# Patient Record
Sex: Female | Born: 1975 | Race: White | Hispanic: No | Marital: Married | Smoking: Never smoker
Health system: Southern US, Community
[De-identification: ages and names within clinical notes are randomized; demographics above are authoritative.]

## PROBLEM LIST (undated history)

## (undated) DIAGNOSIS — R748 Abnormal levels of other serum enzymes: Secondary | ICD-10-CM

## (undated) DIAGNOSIS — E669 Obesity, unspecified: Secondary | ICD-10-CM

## (undated) DIAGNOSIS — K8050 Calculus of bile duct without cholangitis or cholecystitis without obstruction: Secondary | ICD-10-CM

## (undated) DIAGNOSIS — E079 Disorder of thyroid, unspecified: Secondary | ICD-10-CM

## (undated) DIAGNOSIS — K8043 Calculus of bile duct with acute cholecystitis with obstruction: Secondary | ICD-10-CM

## (undated) DIAGNOSIS — E039 Hypothyroidism, unspecified: Secondary | ICD-10-CM

## (undated) DIAGNOSIS — R932 Abnormal findings on diagnostic imaging of liver and biliary tract: Secondary | ICD-10-CM

## (undated) DIAGNOSIS — K8020 Calculus of gallbladder without cholecystitis without obstruction: Secondary | ICD-10-CM

## (undated) HISTORY — DX: Calculus of bile duct without cholangitis or cholecystitis without obstruction: K80.50

## (undated) HISTORY — DX: Hypothyroidism, unspecified: E03.9

## (undated) HISTORY — DX: Abnormal findings on diagnostic imaging of liver and biliary tract: R93.2

## (undated) HISTORY — DX: Calculus of bile duct with acute cholecystitis with obstruction: K80.43

## (undated) HISTORY — DX: Abnormal levels of other serum enzymes: R74.8

## (undated) HISTORY — DX: Obesity, unspecified: E66.9

---

## 2011-09-04 DIAGNOSIS — E669 Obesity, unspecified: Secondary | ICD-10-CM | POA: Insufficient documentation

## 2011-09-04 DIAGNOSIS — E039 Hypothyroidism, unspecified: Secondary | ICD-10-CM

## 2011-09-04 HISTORY — DX: Obesity, unspecified: E66.9

## 2011-09-04 HISTORY — DX: Hypothyroidism, unspecified: E03.9

## 2017-02-06 ENCOUNTER — Inpatient Hospital Stay
Admission: EM | Admit: 2017-02-06 | Discharge: 2017-02-12 | DRG: 419 | Disposition: A | Discharge status: Home / Self Care | Payer: Managed Care, Other (non HMO) | Attending: Surgery | Admitting: Surgery

## 2017-02-06 ENCOUNTER — Encounter: Payer: Self-pay | Admitting: Emergency Medicine

## 2017-02-06 ENCOUNTER — Emergency Department: Payer: Managed Care, Other (non HMO)

## 2017-02-06 DIAGNOSIS — Z79899 Other long term (current) drug therapy: Secondary | ICD-10-CM

## 2017-02-06 DIAGNOSIS — K8067 Calculus of gallbladder and bile duct with acute and chronic cholecystitis with obstruction: Secondary | ICD-10-CM | POA: Diagnosis not present

## 2017-02-06 DIAGNOSIS — M25519 Pain in unspecified shoulder: Secondary | ICD-10-CM | POA: Diagnosis not present

## 2017-02-06 DIAGNOSIS — R932 Abnormal findings on diagnostic imaging of liver and biliary tract: Secondary | ICD-10-CM

## 2017-02-06 DIAGNOSIS — K8050 Calculus of bile duct without cholangitis or cholecystitis without obstruction: Secondary | ICD-10-CM

## 2017-02-06 DIAGNOSIS — K8043 Calculus of bile duct with acute cholecystitis with obstruction: Secondary | ICD-10-CM | POA: Diagnosis present

## 2017-02-06 DIAGNOSIS — R1013 Epigastric pain: Secondary | ICD-10-CM

## 2017-02-06 DIAGNOSIS — K8042 Calculus of bile duct with acute cholecystitis without obstruction: Secondary | ICD-10-CM | POA: Diagnosis not present

## 2017-02-06 DIAGNOSIS — R748 Abnormal levels of other serum enzymes: Secondary | ICD-10-CM

## 2017-02-06 DIAGNOSIS — R001 Bradycardia, unspecified: Secondary | ICD-10-CM | POA: Diagnosis present

## 2017-02-06 DIAGNOSIS — Z793 Long term (current) use of hormonal contraceptives: Secondary | ICD-10-CM

## 2017-02-06 DIAGNOSIS — K56609 Unspecified intestinal obstruction, unspecified as to partial versus complete obstruction: Secondary | ICD-10-CM

## 2017-02-06 DIAGNOSIS — K5900 Constipation, unspecified: Secondary | ICD-10-CM | POA: Diagnosis not present

## 2017-02-06 DIAGNOSIS — Z419 Encounter for procedure for purposes other than remedying health state, unspecified: Secondary | ICD-10-CM

## 2017-02-06 DIAGNOSIS — E039 Hypothyroidism, unspecified: Secondary | ICD-10-CM | POA: Diagnosis present

## 2017-02-06 HISTORY — DX: Disorder of thyroid, unspecified: E07.9

## 2017-02-06 HISTORY — DX: Calculus of gallbladder without cholecystitis without obstruction: K80.20

## 2017-02-06 LAB — CBC
HEMATOCRIT: 37.8 % (ref 35.0–47.0)
Hemoglobin: 13.4 g/dL (ref 12.0–16.0)
MCH: 34.6 pg — ABNORMAL HIGH (ref 26.0–34.0)
MCHC: 35.4 g/dL (ref 32.0–36.0)
MCV: 97.8 fL (ref 80.0–100.0)
Platelets: 286 10*3/uL (ref 150–440)
RBC: 3.86 MIL/uL (ref 3.80–5.20)
RDW: 12.2 % (ref 11.5–14.5)
WBC: 6.8 10*3/uL (ref 3.6–11.0)

## 2017-02-06 LAB — COMPREHENSIVE METABOLIC PANEL
ALBUMIN: 4.2 g/dL (ref 3.5–5.0)
ALK PHOS: 191 U/L — AB (ref 38–126)
ALT: 228 U/L — ABNORMAL HIGH (ref 14–54)
AST: 175 U/L — AB (ref 15–41)
Anion gap: 7 (ref 5–15)
BILIRUBIN TOTAL: 4.1 mg/dL — AB (ref 0.3–1.2)
BUN: 8 mg/dL (ref 6–20)
CO2: 27 mmol/L (ref 22–32)
Calcium: 9.2 mg/dL (ref 8.9–10.3)
Chloride: 105 mmol/L (ref 101–111)
Creatinine, Ser: 0.64 mg/dL (ref 0.44–1.00)
GFR calc Af Amer: >60 mL/min (ref >60)
GFR calc non Af Amer: >60 mL/min (ref >60)
GLUCOSE: 99 mg/dL (ref 65–99)
POTASSIUM: 3.7 mmol/L (ref 3.5–5.1)
Sodium: 139 mmol/L (ref 135–145)
TOTAL PROTEIN: 7.5 g/dL (ref 6.5–8.1)

## 2017-02-06 LAB — URINALYSIS, COMPLETE (UACMP) WITH MICROSCOPIC
Bilirubin Urine: NEGATIVE
Glucose, UA: NEGATIVE mg/dL
HGB URINE DIPSTICK: NEGATIVE
Ketones, ur: NEGATIVE mg/dL
NITRITE: NEGATIVE
Protein, ur: NEGATIVE mg/dL
SPECIFIC GRAVITY, URINE: 1.006 (ref 1.005–1.030)
pH: 6 (ref 5.0–8.0)

## 2017-02-06 LAB — POCT PREGNANCY, URINE: PREG TEST UR: NEGATIVE

## 2017-02-06 LAB — LIPASE, BLOOD: Lipase: 23 U/L (ref 11–51)

## 2017-02-06 MED ORDER — IOPAMIDOL (ISOVUE-300) INJECTION 61%
30.0000 mL | Freq: Once | INTRAVENOUS | Status: AC
  Administered 2017-02-06: 30 mL via ORAL
  Filled 2017-02-06: qty 30

## 2017-02-06 MED ORDER — SODIUM CHLORIDE 0.9 % IV BOLUS (SEPSIS)
1000.0000 mL | Freq: Once | INTRAVENOUS | Status: AC
  Administered 2017-02-06: 1000 mL via INTRAVENOUS

## 2017-02-06 MED ORDER — LACTATED RINGERS IV BOLUS (SEPSIS)
1000.0000 mL | Freq: Once | INTRAVENOUS | Status: AC
  Administered 2017-02-06: 1000 mL via INTRAVENOUS
  Filled 2017-02-06: qty 1000

## 2017-02-06 MED ORDER — PIPERACILLIN-TAZOBACTAM 3.375 G IVPB 30 MIN
3.3750 g | Freq: Once | INTRAVENOUS | Status: AC
  Administered 2017-02-06: 3.375 g via INTRAVENOUS
  Filled 2017-02-06: qty 50

## 2017-02-06 MED ORDER — IOPAMIDOL (ISOVUE-300) INJECTION 61%
100.0000 mL | Freq: Once | INTRAVENOUS | Status: AC | PRN
  Administered 2017-02-06: 100 mL via INTRAVENOUS
  Filled 2017-02-06: qty 100

## 2017-02-06 MED ORDER — ONDANSETRON HCL 4 MG/2ML IJ SOLN
4.0000 mg | Freq: Once | INTRAMUSCULAR | Status: AC
  Administered 2017-02-06: 4 mg via INTRAVENOUS
  Filled 2017-02-06: qty 2

## 2017-02-06 MED ORDER — MORPHINE SULFATE (PF) 4 MG/ML IV SOLN
2.0000 mg | Freq: Once | INTRAVENOUS | Status: AC
  Administered 2017-02-06: 2 mg via INTRAVENOUS
  Filled 2017-02-06: qty 1

## 2017-02-06 NOTE — ED Provider Notes (Signed)
[facility] Emergency Department Provider Note  ____________________________________________  Time seen: Approximately 10:05 PM  I have reviewed the triage vital signs and the nursing notes.   HISTORY  Chief Complaint Abdominal Pain and Back Pain    HPI Brittney Johnston is a 41 y.o. female with a history of cholelithiasis who complains of epigastric pain radiating to the back for the past 2-3 days, constant. Worse with deep breathing. Worse with eating. She's had episodes of biliary colic in the past. Denies fever. Has noted that her urine is turned to dark or yellow color over the last few days as well. Denies dysuria frequency urgency. Denies vomiting with this episode.Pain is severe.       Past Medical History:  Diagnosis Date  . Gallstones   . Thyroid disease    hypothyroidism     There are no active problems to display for this patient.    History reviewed. No pertinent surgical history.   Prior to Admission medications   Not on File  Synthroid 112 Low-Ogestrel   Allergies Patient has no known allergies.   No family history on file.  Social History Social History  Substance Use Topics  . Smoking status: Never Smoker  . Smokeless tobacco: Never Used  . Alcohol use No    Review of Systems  Constitutional:   No fever or chills.  ENT:   No sore throat. No rhinorrhea. Cardiovascular:   No chest pain. Respiratory:   No dyspnea or cough. Gastrointestinal:   Positive upper abdominal pain as above without vomiting and diarrhea.  Genitourinary:   Negative for dysuria or difficulty urinating. Musculoskeletal:   Negative for focal pain or swelling Neurological:   Negative for headaches 10-point ROS otherwise negative.  ____________________________________________   PHYSICAL EXAM:  VITAL SIGNS: ED Triage Vitals  Enc Vitals Group     BP 02/06/17 1826 115/60     Pulse Rate 02/06/17 1826 63     Resp 02/06/17 1826 18     Temp  02/06/17 1826 97.7 F (36.5 C)     Temp Source 02/06/17 1826 Oral     SpO2 02/06/17 1826 100 %     Weight 02/06/17 1826 165 lb (74.8 kg)     Height 02/06/17 1826 5\' 5"  (1.651 m)     Head Circumference --      Peak Flow --      Pain Score 02/06/17 1840 6     Pain Loc --      Pain Edu? --      Excl. in GC? --     Vital signs reviewed, nursing assessments reviewed.   Constitutional:   Alert and oriented. Well appearing and in no distress. Eyes:   No scleral icterus. No conjunctival pallor. PERRL. EOMI.  No nystagmus. ENT   Head:   Normocephalic and atraumatic.   Nose:   No congestion/rhinnorhea. No septal hematoma   Mouth/Throat:   MMM, no pharyngeal erythema. No peritonsillar mass.    Neck:   No stridor. No SubQ emphysema. No meningismus. Hematological/Lymphatic/Immunilogical:   No cervical lymphadenopathy. Cardiovascular:   RRR. Symmetric bilateral radial and DP pulses.  No murmurs.  Respiratory:   Normal respiratory effort without tachypnea nor retractions. Breath sounds are clear and equal bilaterally. No wheezes/rales/rhonchi. Gastrointestinal:   Soft with epigastric and right upper quadrant tenderness. Non distended. There is no CVA tenderness.  No rebound, rigidity, or guarding. Genitourinary:   deferred Musculoskeletal:   Normal range of motion in all extremities.  No joint effusions.  No lower extremity tenderness.  No edema. Neurologic:   Normal speech and language.  CN 2-10 normal. Motor grossly intact. No gross focal neurologic deficits are appreciated.  Skin:    Skin is warm, dry and intact. No rash noted.  No petechiae, purpura, or bullae.  ____________________________________________    LABS (pertinent positives/negatives) (all labs ordered are listed, but only abnormal results are displayed) Labs Reviewed  COMPREHENSIVE METABOLIC PANEL - Abnormal; Notable for the following:       Result Value   AST 175 (*)    ALT 228 (*)    Alkaline Phosphatase  191 (*)    Total Bilirubin 4.1 (*)    All other components within normal limits  CBC - Abnormal; Notable for the following:    MCH 34.6 (*)    All other components within normal limits  URINALYSIS, COMPLETE (UACMP) WITH MICROSCOPIC - Abnormal; Notable for the following:    Color, Urine AMBER (*)    APPearance CLEAR (*)    Leukocytes, UA TRACE (*)    Bacteria, UA RARE (*)    Squamous Epithelial / LPF 0-5 (*)    All other components within normal limits  LIPASE, BLOOD  POC URINE PREG, ED  POCT PREGNANCY, URINE   ____________________________________________   EKG  Interpreted by me Sinus bradycardia rate of 59, right axis deviation. Normal QRS ST segments and T waves.  ____________________________________________    RADIOLOGY  Ct Abdomen Pelvis W Contrast  Addendum Date: 02/06/2017   ADDENDUM REPORT: 02/06/2017 22:01 ADDENDUM: A subtle density in the distal CBD is also noted suspicious for choledocholithiasis. This is best appreciated on the coronal images. Electronically Signed   By: Tollie Eth M.D.   On: 02/06/2017 22:01   Result Date: 02/06/2017 CLINICAL DATA:  Upper abdominal pain between the ribs radiating into the back. EXAM: CT ABDOMEN AND PELVIS WITH CONTRAST TECHNIQUE: Multidetector CT imaging of the abdomen and pelvis was performed using the standard protocol following bolus administration of intravenous contrast. CONTRAST:  ISOVUE-300 IOPAMIDOL (ISOVUE-300) INJECTION 61% COMPARISON:  None. FINDINGS: Lower chest: Small hiatal hernia. Bibasilar dependent atelectasis. Normal size cardiac chambers without pericardial effusion noted. Hepatobiliary: Thick-walled gallbladder measuring up to 5 mm in single wall thickness with pericholecystic fluid and gallstones. Mild periportal edema. No space-occupying mass of the liver. Pancreas: Unremarkable. No pancreatic ductal dilatation or surrounding inflammatory changes. Spleen: Normal in size without focal abnormality.  Adrenals/Urinary Tract: Adrenal glands are unremarkable. Kidneys are normal, without renal calculi, focal lesion, or hydronephrosis. Bladder is unremarkable. Stomach/Bowel: Stomach is within normal limits. Appendix appears normal. No evidence of bowel wall thickening, distention, or inflammatory changes. Vascular/Lymphatic: No significant vascular findings are present. No enlarged abdominal or pelvic lymph nodes. Reproductive: Uterus and bilateral adnexa are unremarkable. Other: No abdominal wall hernia or abnormality. No abdominopelvic ascites. Musculoskeletal: No acute or significant osseous findings. IMPRESSION: Findings in keeping with acute cholecystitis with thick-walled gallbladder, pericholecystic fluid and numerous gallstones present. Periportal edema is noted. Electronically Signed: By: Tollie Eth M.D. On: 02/06/2017 21:50    ____________________________________________   PROCEDURES Procedures  ____________________________________________   INITIAL IMPRESSION / ASSESSMENT AND PLAN / ED COURSE  Pertinent labs & imaging results that were available during my care of the patient were reviewed by me and considered in my medical decision making (see chart for details).  Patient presents with epigastric pain. LFTs all elevated and pattern to suggest biliary obstruction. CT is consistent with choledocholithiasis as well as cholecystitis.  IV Zosyn initiated. Nothing by mouth, continue IV fluids for hydration. No GI coverage here at [facility] today but Dr. Servando Snare is on tomorrow.    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 I discussed with the [facility] transfer center at 10:10 PM, they report that they're on diversion but could put the patient on a wait list for [facility] but that she wouldn't go to get transferred until after 7 AM at the earliest. I therefore discussed again with our hospitalist who requests that I  first discussed with general surgery to determine which service is most appropriate to admit the patient here at [facility]. Dr. Everlene Farrier is currently in the middle of a case in the operating room, but I informed his OR nurse of the need for him to consult on this patient for admission planning.      ____________________________________________   FINAL CLINICAL IMPRESSION(S) / ED DIAGNOSES  Final diagnoses:  Choledocholithiasis with acute cholecystitis with obstruction  Acute epigastric pain      New Prescriptions   No medications on file     Portions of this note were generated with dragon dictation software. Dictation errors may occur despite best attempts at proofreading.    Sharman Cheek, MD 02/06/17 [phone]

## 2017-02-06 NOTE — ED Notes (Signed)
Patient a month or so ago had a gallbladder attack. Patient is not sure if this is the same thing but she is not able to get comfortable. Motrin without relief. Feels like a corset. Acid reflux. No pain last night when went to bed but pain returned today without eating and progressively worse through the day. Has gallbladder ultrasound schedule Monday. Urine is dark yellow. No urinary symptoms.

## 2017-02-06 NOTE — ED Triage Notes (Addendum)
Pt comes into the ED via POV c/o upper abdominal pain under the ribs and into the back.  Patient states she has been diagnosed with gallstones and had similar episode with her gallbladder attack.  Patient still has the tightness around her rib cage and then sharp pain that goes from upper abdomen to the back.  Patient in NAD at this time with even and unlabored respirations.  Patient able to ambulate to triage room with no difficulty.  Patient states she felt better sitting up. Patient states her urine has also become much darker the past couple of days.

## 2017-02-07 ENCOUNTER — Emergency Department: Payer: Managed Care, Other (non HMO)

## 2017-02-07 ENCOUNTER — Inpatient Hospital Stay: Payer: Managed Care, Other (non HMO) | Admitting: Certified Registered Nurse Anesthetist

## 2017-02-07 ENCOUNTER — Encounter: Payer: Self-pay | Admitting: *Deleted

## 2017-02-07 ENCOUNTER — Ambulatory Visit: Admit: 2017-02-07 | Payer: Managed Care, Other (non HMO) | Admitting: Gastroenterology

## 2017-02-07 ENCOUNTER — Encounter
Admission: EM | Disposition: A | Discharge status: Home / Self Care | Payer: Self-pay | Source: Home / Self Care | Attending: Surgery

## 2017-02-07 DIAGNOSIS — K8042 Calculus of bile duct with acute cholecystitis without obstruction: Secondary | ICD-10-CM

## 2017-02-07 DIAGNOSIS — M25519 Pain in unspecified shoulder: Secondary | ICD-10-CM | POA: Diagnosis not present

## 2017-02-07 DIAGNOSIS — Z79899 Other long term (current) drug therapy: Secondary | ICD-10-CM | POA: Diagnosis not present

## 2017-02-07 DIAGNOSIS — K8050 Calculus of bile duct without cholangitis or cholecystitis without obstruction: Secondary | ICD-10-CM | POA: Diagnosis not present

## 2017-02-07 DIAGNOSIS — K5900 Constipation, unspecified: Secondary | ICD-10-CM | POA: Diagnosis not present

## 2017-02-07 DIAGNOSIS — R932 Abnormal findings on diagnostic imaging of liver and biliary tract: Secondary | ICD-10-CM

## 2017-02-07 DIAGNOSIS — E039 Hypothyroidism, unspecified: Secondary | ICD-10-CM | POA: Diagnosis present

## 2017-02-07 DIAGNOSIS — K8043 Calculus of bile duct with acute cholecystitis with obstruction: Secondary | ICD-10-CM

## 2017-02-07 DIAGNOSIS — R748 Abnormal levels of other serum enzymes: Secondary | ICD-10-CM

## 2017-02-07 DIAGNOSIS — K8067 Calculus of gallbladder and bile duct with acute and chronic cholecystitis with obstruction: Secondary | ICD-10-CM | POA: Diagnosis present

## 2017-02-07 DIAGNOSIS — K8041 Calculus of bile duct with cholecystitis, unspecified, with obstruction: Secondary | ICD-10-CM | POA: Diagnosis not present

## 2017-02-07 DIAGNOSIS — R001 Bradycardia, unspecified: Secondary | ICD-10-CM | POA: Diagnosis present

## 2017-02-07 DIAGNOSIS — Z793 Long term (current) use of hormonal contraceptives: Secondary | ICD-10-CM | POA: Diagnosis not present

## 2017-02-07 HISTORY — PX: ERCP: SHX5425

## 2017-02-07 HISTORY — DX: Calculus of bile duct with acute cholecystitis with obstruction: K80.43

## 2017-02-07 LAB — CBC
HCT: 34.4 % — ABNORMAL LOW (ref 35.0–47.0)
Hemoglobin: 12.2 g/dL (ref 12.0–16.0)
MCH: 35 pg — ABNORMAL HIGH (ref 26.0–34.0)
MCHC: 35.5 g/dL (ref 32.0–36.0)
MCV: 98.5 fL (ref 80.0–100.0)
PLATELETS: 228 10*3/uL (ref 150–440)
RBC: 3.49 MIL/uL — ABNORMAL LOW (ref 3.80–5.20)
RDW: 12 % (ref 11.5–14.5)
WBC: 5.4 10*3/uL (ref 3.6–11.0)

## 2017-02-07 LAB — COMPREHENSIVE METABOLIC PANEL
ALBUMIN: 3.4 g/dL — AB (ref 3.5–5.0)
ALK PHOS: 166 U/L — AB (ref 38–126)
ALT: 223 U/L — AB (ref 14–54)
AST: 167 U/L — AB (ref 15–41)
Anion gap: 5 (ref 5–15)
BILIRUBIN TOTAL: 3.7 mg/dL — AB (ref 0.3–1.2)
BUN: 5 mg/dL — AB (ref 6–20)
CALCIUM: 8.5 mg/dL — AB (ref 8.9–10.3)
CO2: 25 mmol/L (ref 22–32)
CREATININE: 0.55 mg/dL (ref 0.44–1.00)
Chloride: 108 mmol/L (ref 101–111)
GFR calc Af Amer: >60 mL/min (ref >60)
GLUCOSE: 104 mg/dL — AB (ref 65–99)
Potassium: 3.8 mmol/L (ref 3.5–5.1)
Sodium: 138 mmol/L (ref 135–145)
TOTAL PROTEIN: 6.2 g/dL — AB (ref 6.5–8.1)

## 2017-02-07 SURGERY — ERCP, WITH INTERVENTION IF INDICATED
Anesthesia: General

## 2017-02-07 SURGERY — ENDOSCOPIC RETROGRADE CHOLANGIOPANCREATOGRAPHY (ERCP) WITH PROPOFOL
Anesthesia: Monitor Anesthesia Care

## 2017-02-07 MED ORDER — PIPERACILLIN-TAZOBACTAM 3.375 G IVPB
3.3750 g | Freq: Three times a day (TID) | INTRAVENOUS | Status: DC
  Administered 2017-02-07 – 2017-02-09 (×6): 3.375 g via INTRAVENOUS
  Filled 2017-02-07 (×6): qty 50

## 2017-02-07 MED ORDER — HYDROMORPHONE HCL 1 MG/ML IJ SOLN
0.5000 mg | INTRAMUSCULAR | Status: DC | PRN
  Administered 2017-02-08 – 2017-02-09 (×3): 0.5 mg via INTRAVENOUS
  Filled 2017-02-07 (×3): qty 0.5

## 2017-02-07 MED ORDER — FENTANYL CITRATE (PF) 100 MCG/2ML IJ SOLN: 25.0000 ug | INTRAMUSCULAR | Status: DC | PRN

## 2017-02-07 MED ORDER — DIPHENHYDRAMINE HCL 50 MG/ML IJ SOLN: 12.5000 mg | Freq: Four times a day (QID) | INTRAMUSCULAR | Status: DC | PRN

## 2017-02-07 MED ORDER — PROPOFOL 10 MG/ML IV BOLUS
INTRAVENOUS | Status: AC
  Filled 2017-02-07: qty 20

## 2017-02-07 MED ORDER — DIPHENHYDRAMINE HCL 12.5 MG/5ML PO ELIX: 12.5000 mg | ORAL_SOLUTION | Freq: Four times a day (QID) | ORAL | Status: DC | PRN

## 2017-02-07 MED ORDER — PANTOPRAZOLE SODIUM 40 MG IV SOLR
40.0000 mg | Freq: Two times a day (BID) | INTRAVENOUS | Status: DC
  Administered 2017-02-07 – 2017-02-09 (×7): 40 mg via INTRAVENOUS
  Filled 2017-02-07 (×6): qty 40

## 2017-02-07 MED ORDER — DEXTROSE IN LACTATED RINGERS 5 % IV SOLN
INTRAVENOUS | Status: DC
  Administered 2017-02-07 – 2017-02-08 (×5): via INTRAVENOUS

## 2017-02-07 MED ORDER — LIDOCAINE HCL (CARDIAC) 20 MG/ML IV SOLN
INTRAVENOUS | Status: DC | PRN
  Administered 2017-02-07: 50 mg via INTRAVENOUS

## 2017-02-07 MED ORDER — PROPOFOL 500 MG/50ML IV EMUL
INTRAVENOUS | Status: AC
  Filled 2017-02-07: qty 50

## 2017-02-07 MED ORDER — ONDANSETRON 4 MG PO TBDP: 4.0000 mg | ORAL_TABLET | Freq: Four times a day (QID) | ORAL | Status: DC | PRN

## 2017-02-07 MED ORDER — KETOROLAC TROMETHAMINE 30 MG/ML IJ SOLN
30.0000 mg | Freq: Four times a day (QID) | INTRAMUSCULAR | Status: DC | PRN
  Administered 2017-02-07: 30 mg via INTRAVENOUS
  Filled 2017-02-07: qty 1

## 2017-02-07 MED ORDER — LEVOTHYROXINE SODIUM 112 MCG PO TABS
112.0000 ug | ORAL_TABLET | Freq: Every day | ORAL | Status: DC
  Administered 2017-02-07 – 2017-02-12 (×6): 112 ug via ORAL
  Filled 2017-02-07 (×7): qty 1

## 2017-02-07 MED ORDER — ONDANSETRON HCL 4 MG/2ML IJ SOLN: 4.0000 mg | Freq: Once | INTRAMUSCULAR | Status: DC | PRN

## 2017-02-07 MED ORDER — LIDOCAINE HCL (PF) 2 % IJ SOLN
INTRAMUSCULAR | Status: AC
  Filled 2017-02-07: qty 2

## 2017-02-07 MED ORDER — DEXAMETHASONE SODIUM PHOSPHATE 10 MG/ML IJ SOLN
INTRAMUSCULAR | Status: DC | PRN
  Administered 2017-02-07: 10 mg via INTRAVENOUS

## 2017-02-07 MED ORDER — FENTANYL CITRATE (PF) 100 MCG/2ML IJ SOLN
INTRAMUSCULAR | Status: AC
  Filled 2017-02-07: qty 2

## 2017-02-07 MED ORDER — PROPOFOL 10 MG/ML IV BOLUS
INTRAVENOUS | Status: DC | PRN
  Administered 2017-02-07: 30 mg via INTRAVENOUS

## 2017-02-07 MED ORDER — INDOMETHACIN 50 MG RE SUPP
100.0000 mg | Freq: Once | RECTAL | Status: AC
  Administered 2017-02-07: 100 mg via RECTAL

## 2017-02-07 MED ORDER — MIDAZOLAM HCL 2 MG/2ML IJ SOLN
INTRAMUSCULAR | Status: DC | PRN
  Administered 2017-02-07: 2 mg via INTRAVENOUS

## 2017-02-07 MED ORDER — MIDAZOLAM HCL 2 MG/2ML IJ SOLN
INTRAMUSCULAR | Status: AC
  Filled 2017-02-07: qty 2

## 2017-02-07 MED ORDER — SUCCINYLCHOLINE CHLORIDE 20 MG/ML IJ SOLN
INTRAMUSCULAR | Status: AC
  Filled 2017-02-07: qty 1

## 2017-02-07 MED ORDER — FENTANYL CITRATE (PF) 100 MCG/2ML IJ SOLN
INTRAMUSCULAR | Status: DC | PRN
  Administered 2017-02-07 (×2): 25 ug via INTRAVENOUS
  Administered 2017-02-07: 50 ug via INTRAVENOUS

## 2017-02-07 MED ORDER — ENOXAPARIN SODIUM 40 MG/0.4ML ~~LOC~~ SOLN
40.0000 mg | SUBCUTANEOUS | Status: DC
  Administered 2017-02-08 – 2017-02-12 (×4): 40 mg via SUBCUTANEOUS
  Filled 2017-02-07 (×4): qty 0.4

## 2017-02-07 MED ORDER — ONDANSETRON HCL 4 MG/2ML IJ SOLN
INTRAMUSCULAR | Status: DC | PRN
  Administered 2017-02-07: 4 mg via INTRAVENOUS

## 2017-02-07 MED ORDER — ONDANSETRON HCL 4 MG/2ML IJ SOLN
4.0000 mg | Freq: Four times a day (QID) | INTRAMUSCULAR | Status: DC | PRN
  Administered 2017-02-08: 4 mg via INTRAVENOUS
  Filled 2017-02-07: qty 2

## 2017-02-07 MED ORDER — PROPOFOL 500 MG/50ML IV EMUL
INTRAVENOUS | Status: DC | PRN
  Administered 2017-02-07: 140 ug/kg/min via INTRAVENOUS

## 2017-02-07 MED ORDER — SODIUM CHLORIDE 0.9 % IV SOLN
INTRAVENOUS | Status: DC
  Administered 2017-02-07: 1000 mL via INTRAVENOUS

## 2017-02-07 NOTE — Anesthesia Procedure Notes (Signed)
Date/Time: 02/07/2017 4:34 PM Performed by: Ginger CarneMICHELET, Ameena Vesey Pre-anesthesia Checklist: Patient identified, Emergency Drugs available, Suction available, Patient being monitored and Timeout performed Patient Re-evaluated:Patient Re-evaluated prior to inductionOxygen Delivery Method: Nasal cannula

## 2017-02-07 NOTE — ED Provider Notes (Signed)
-----------------------------------------   12:28 AM on 02/07/2017 -----------------------------------------   Blood pressure 110/66, pulse 60, temperature 97.7 F (36.5 C), temperature source Oral, resp. rate 16, height 5\' 5"  (1.651 m), weight 165 lb (74.8 kg), last menstrual period 01/16/2017, SpO2 99 %.  Assuming care from Dr. Roxan Hockeyobinson.  In short, Brittney Johnston is a 41 y.o. female with a chief complaint of Abdominal Pain and Back Pain .  Refer to the original H&P for additional details.  The current plan of care is to discuss the case with Dr. Everlene FarrierPabon to see if the patient can be admitted at [facility].     I discussed the case with Dr. Everlene FarrierPabon and he will admit the patient to his service   Rebecka ApleyAllison P Arrington Yohe, MD 02/07/17 [phone]

## 2017-02-07 NOTE — Progress Notes (Signed)
CC: Choledocholithiasis Subjective: Patient seen and examined on the unit. Continues to report occasional pain to the right upper quadrant states it is improving with the when necessary medications. Denies any current nausea or vomiting.  Objective: Vital signs in last 24 hours: Temp:  [97.7 F (36.5 C)-98.2 F (36.8 C)] 97.7 F (36.5 C) (03/01 1308) Pulse Rate:  [45-101] 101 (03/01 1405) Resp:  [16-18] 16 (03/01 1308) BP: (94-115)/(41-66) 115/60 (03/01 1405) SpO2:  [96 %-100 %] 97 % (03/01 1405) Weight:  [74.8 kg (165 lb)-78.7 kg (173 lb 8 oz)] 78.7 kg (173 lb 8 oz) (03/01 0232) Last BM Date: 02/05/17  Intake/Output from previous day: 02/28 0701 - 03/01 0700 In: 2390 [I.V.:340; IV Piggyback:2050] Out: 1000 [Urine:1000] Intake/Output this shift: Total I/O In: 1105 [I.V.:1055; IV Piggyback:50] Out: 1200 [Urine:1200]  Physical exam:  Gen.: No acute distress Chest: Clear to auscultation Heart: Tachycardic Abdomen: Soft, nondistended, minimally tender to palpation in the upper abdomen.  Lab Results: CBC   Recent Labs  02/06/17 1841 02/07/17 0451  WBC 6.8 5.4  HGB 13.4 12.2  HCT 37.8 34.4*  PLT 286 228   BMET  Recent Labs  02/06/17 1841 02/07/17 0451  NA 139 138  K 3.7 3.8  CL 105 108  CO2 27 25  GLUCOSE 99 104*  BUN 8 5*  CREATININE 0.64 0.55  CALCIUM 9.2 8.5*   PT/INR No results for input(s): LABPROT, INR in the last 72 hours. ABG No results for input(s): PHART, HCO3 in the last 72 hours.  Invalid input(s): PCO2, PO2  Studies/Results: Ct Abdomen Pelvis W Contrast  Addendum Date: 02/06/2017   ADDENDUM REPORT: 02/06/2017 22:01 ADDENDUM: A subtle density in the distal CBD is also noted suspicious for choledocholithiasis. This is best appreciated on the coronal images. Electronically Signed   By: Tollie Ethavid  Kwon M.D.   On: 02/06/2017 22:01   Result Date: 02/06/2017 CLINICAL DATA:  Upper abdominal pain between the ribs radiating into the back. EXAM: CT  ABDOMEN AND PELVIS WITH CONTRAST TECHNIQUE: Multidetector CT imaging of the abdomen and pelvis was performed using the standard protocol following bolus administration of intravenous contrast. CONTRAST:  100mL ISOVUE-300 IOPAMIDOL (ISOVUE-300) INJECTION 61% COMPARISON:  None. FINDINGS: Lower chest: Small hiatal hernia. Bibasilar dependent atelectasis. Normal size cardiac chambers without pericardial effusion noted. Hepatobiliary: Thick-walled gallbladder measuring up to 5 mm in single wall thickness with pericholecystic fluid and gallstones. Mild periportal edema. No space-occupying mass of the liver. Pancreas: Unremarkable. No pancreatic ductal dilatation or surrounding inflammatory changes. Spleen: Normal in size without focal abnormality. Adrenals/Urinary Tract: Adrenal glands are unremarkable. Kidneys are normal, without renal calculi, focal lesion, or hydronephrosis. Bladder is unremarkable. Stomach/Bowel: Stomach is within normal limits. Appendix appears normal. No evidence of bowel wall thickening, distention, or inflammatory changes. Vascular/Lymphatic: No significant vascular findings are present. No enlarged abdominal or pelvic lymph nodes. Reproductive: Uterus and bilateral adnexa are unremarkable. Other: No abdominal wall hernia or abnormality. No abdominopelvic ascites. Musculoskeletal: No acute or significant osseous findings. IMPRESSION: Findings in keeping with acute cholecystitis with thick-walled gallbladder, pericholecystic fluid and numerous gallstones present. Periportal edema is noted. Electronically Signed: By: Tollie Ethavid  Kwon M.D. On: 02/06/2017 21:50   [place] Abdomen Limited Ruq  Result Date: 02/07/2017 CLINICAL DATA:  Assess acute cholecystitis noted on recent CT. Initial encounter. EXAM: US ABDOMEN LIMITED - RIGHT UPPER QUADRANT COMPARISON:  CT of the abdomen and pelvis performed 02/06/2017 FINDINGS: Gallbladder: Multiple stones are seen dependently within the gallbladder, with diffuse  gallbladder wall thickening  measuring up to 5 mm, and pericholecystic fluid. A positive ultrasonographic Murphy's sign is elicited. Common bile duct: Diameter: 1.0 cm. A 0.8 cm obstructing stone is noted at the distal common bile duct. Liver: No focal lesion identified. Within normal limits in parenchymal echogenicity. IMPRESSION: 1. Acute cholecystitis, with diffuse gallbladder wall thickening, pericholecystic fluid and positive ultrasonographic Murphy's sign. Underlying cholelithiasis noted. 2. Common bile duct dilated to 1.0 cm, with a 0.8 cm obstructing stone at the distal common bile duct. Electronically Signed   By: Roanna Raider M.D.   On: 02/07/2017 01:23    Anti-infectives: Anti-infectives    Start     Dose/Rate Route Frequency Ordered Stop   02/07/17 0100  piperacillin-tazobactam (ZOSYN) IVPB 3.375 g     3.375 g 12.5 mL/hr over 240 Minutes Intravenous Every 8 hours 02/07/17 0049     02/06/17 2200  piperacillin-tazobactam (ZOSYN) IVPB 3.375 g     3.375 g 100 mL/hr over 30 Minutes Intravenous  Once 02/06/17 2156 02/06/17 2240      Assessment/Plan:  41 year old female admitted with acute cholecystitis and choledocholithiasis. GI has been consuls is an the patient is scheduled for ERCP later today. Discussed with patient the timing for her cholecystectomy is dependent on the ERCP findings. All questions answered to her satisfaction. Appreciate GI evaluation.  Najat Olazabal T. Tonita Cong, MD, [facility]  Day [facility] [phone] Night [facility] [phone] 02/07/2017

## 2017-02-07 NOTE — ED Notes (Signed)
Patient transported to Ultrasound at this time. 

## 2017-02-07 NOTE — Anesthesia Postprocedure Evaluation (Signed)
Anesthesia Post Note  Patient: Greig Rightmily Pickart  Procedure(s) Performed: Procedure(s) (LRB): ENDOSCOPIC RETROGRADE CHOLANGIOPANCREATOGRAPHY (ERCP) (N/A)  Patient location during evaluation: Endoscopy Anesthesia Type: General Level of consciousness: awake and alert Pain management: pain level controlled Vital Signs Assessment: post-procedure vital signs reviewed and stable Respiratory status: spontaneous breathing and respiratory function stable Cardiovascular status: stable Anesthetic complications: no     Last Vitals:  Vitals:   02/07/17 1706 02/07/17 1716  BP: (!) 90/40 (!) 92/47  Pulse: (!) 50 (!) 44  Resp: 16 13  Temp: 36.9 C     Last Pain:  Vitals:   02/07/17 1716  TempSrc:   PainSc: 0-No pain                 Adilynne Fitzwater K

## 2017-02-07 NOTE — Anesthesia Preprocedure Evaluation (Signed)
Anesthesia Evaluation  Patient identified by MRN, date of birth, ID band Patient awake    Reviewed: Allergy & Precautions, NPO status , Patient's Chart, lab work & pertinent test results, reviewed documented beta blocker date and time   Airway Mallampati: III  TM Distance: >3 FB     Dental  (+) Chipped   Pulmonary           Cardiovascular      Neuro/Psych    GI/Hepatic   Endo/Other    Renal/GU      Musculoskeletal   Abdominal   Peds  Hematology   Anesthesia Other Findings   Reproductive/Obstetrics                             Anesthesia Physical Anesthesia Plan  ASA: III  Anesthesia Plan: General   Post-op Pain Management:    Induction: Intravenous  Airway Management Planned: Oral ETT  Additional Equipment:   Intra-op Plan:   Post-operative Plan:   Informed Consent: I have reviewed the patients History and Physical, chart, labs and discussed the procedure including the risks, benefits and alternatives for the proposed anesthesia with the patient or authorized representative who has indicated his/her understanding and acceptance.     Plan Discussed with: CRNA  Anesthesia Plan Comments:         Anesthesia Quick Evaluation

## 2017-02-07 NOTE — Anesthesia Post-op Follow-up Note (Cosign Needed)
Anesthesia QCDR form completed.        

## 2017-02-07 NOTE — Op Note (Signed)
[facility] G[facility]enterology Patient Name: Brittney Johnston Code Procedure Date: 02/07/2017 4:27 PM MRN: [id number] Account #: [id number] Date of Birth: Nov 02, 1976 Admit Type: Outpatient Age: 41 [facility] ENDO ROOM 1 Gender: Female Note Status: Finalized Procedure:            ERCP Indications:          Bile duct stone(s), Elevated liver enzymes Providers:            Midge Miniumarren Kailyn Dubie MD, MD Referring MD:         Courtney ParisElizabeth B. White, MD (Referring MD) Medicines:            Propofol per Anesthesia Complications:        No immediate complications. Procedure:            Pre-Anesthesia Assessment:                       - Prior to the procedure, a History and Physical was                        performed, and patient medications and allergies were                        reviewed. The patient's tolerance of previous                        anesthesia was also reviewed. The risks and benefits of                        the procedure and the sedation options and risks were                        discussed with the patient. All questions were                        answered, and informed consent was obtained. Prior                        Anticoagulants: The patient has taken no previous                        anticoagulant or antiplatelet agents. ASA Grade                        Assessment: II - A patient with mild systemic disease.                        After reviewing the risks and benefits, the patient was                        deemed in satisfactory condition to undergo the                        procedure.                       After obtaining informed consent, the scope was passed                        under direct vision. Throughout the procedure, the  patient's blood pressure, pulse, and oxygen saturations                        were monitored continuously. The Endosonoscope was                        introduced through the mouth, and used to inject                 contrast into and used to inject contrast into the bile                        duct. The ERCP was accomplished without difficulty. The                        patient tolerated the procedure well. Findings:      The scout film was normal. The esophagus was successfully intubated       under direct vision. The scope was advanced to a normal major papilla in       the descending duodenum without detailed examination of the pharynx,       larynx and associated structures, and upper GI tract. The upper GI tract       was grossly normal. The bile duct was deeply cannulated with the       short-nosed traction sphincterotome. Contrast was injected. I personally       interpreted the bile duct images. There was brisk flow of contrast       through the ducts. Image quality was excellent. Contrast extended to the       entire biliary tree. The lower third of the main bile duct contained one       stone, which was 8 mm in diameter. A straight Roadrunner wire was passed       into the biliary tree. A 6 mm biliary sphincterotomy was made with a       traction (standard) sphincterotome using ERBE electrocautery. There was       no post-sphincterotomy bleeding. The lower third of the main bile duct       contained filling defect(s) thought to be a stone. To discover objects,       the biliary tree was swept with a basket starting at the bifurcation.       Nothing was found. One stone was removed. No stones remained. To       discover objects, the biliary tree was swept with a 15 mm balloon       starting at the bifurcation. Nothing was found. Impression:           - A filling defect consistent with a stone was seen on                        the cholangiogram.                       - A biliary sphincterotomy was performed.                       - Choledocholithiasis was found. Complete removal was                        accomplished by biliary sphincterotomy and basket  extraction Recommendation:       - Return patient to hospital ward for ongoing care. Procedure Code(s):    --- Professional ---                       [phone], Endoscopic retrograde cholangiopancreatography                        (ERCP); with removal of calculi/debris from                        biliary/pancreatic duct(s)                       [id number], Endoscopic retrograde cholangiopancreatography                        (ERCP); with sphincterotomy/papillotomy                       [phone], Endoscopic catheterization of the biliary ductal                        system, radiological supervision and interpretation Diagnosis Code(s):    --- Professional ---                       K80.50, Calculus of bile duct without cholangitis or                        cholecystitis without obstruction                       R74.8, Abnormal levels of other serum enzymes                       R93.2, Abnormal findings on diagnostic imaging of liver                        and biliary tract CPT copyright 2016 [employer]. All rights reserved. The codes documented in this report are preliminary and upon coder review may  be revised to meet current compliance requirements. Midge Minium MD, MD 02/07/2017 5:30:45 PM This report has been signed electronically. Number of Addenda: 0 Note Initiated On: 02/07/2017 4:27 PM      [facility]

## 2017-02-07 NOTE — Progress Notes (Signed)
S/p ERCP doing well vss abd soft NT  Plan lap chole in am as OR schedule allows D/w the pt about the plan

## 2017-02-07 NOTE — Progress Notes (Signed)
Patient sent via bed to endoscopy

## 2017-02-07 NOTE — Transfer of Care (Signed)
Immediate Anesthesia Transfer of Care Note  Patient: Brittney Johnston  Procedure(s) Performed: Procedure(s): ENDOSCOPIC RETROGRADE CHOLANGIOPANCREATOGRAPHY (ERCP) (N/A)  Patient Location: PACU  Anesthesia Type:General  Level of Consciousness: awake and alert   Airway & Oxygen Therapy: Patient Spontanous Breathing and Patient connected to nasal cannula oxygen  Post-op Assessment: Report given to RN and Post -op Vital signs reviewed and stable  Post vital signs: Reviewed and stable  Last Vitals:  Vitals:   02/07/17 1550 02/07/17 1706  BP: (!) 109/53 (!) 90/40  Pulse: 100 (!) 50  Resp: 16 13  Temp: 36.3 C 36.9 C    Last Pain:  Vitals:   02/07/17 1706  TempSrc: Tympanic  PainSc:       Patients Stated Pain Goal: 1 (02/07/17 1550)  Complications: No apparent anesthesia complications

## 2017-02-07 NOTE — H&P (Signed)
Patient ID: Brittney Johnston, female   DOB: [birth year], 41 y.o.   MRN: [id number]  HPI Brittney Johnston is a 41 y.o. female with a sudden onset of abdominal pain located in addition the right upper quadrant and epigastric area. Patient reports that this pain was a Pressure-type of pain. He was intermittent and now today became constant. The pain was severe at that time and RCA with decreased appetite. No evidence of fevers or chills. She does report dark urine but no evidence of acholia. She states also that she had a similar episode and couple months ago and actually went to her primary care physician and this subsided on its own. Part of the workup included a CT scan of the abdomen and pelvis and I have personally reviewed it there is evidence of thickened gallbladder wall with pericholecystic fluid and multiple stones consistent with acute cholecystitis. He does have evidence of biliary obstructions and as reflected in her increase in LFTs, bilirubin and alkaline phosphatase. He has good cardiovascular performance and is able to do more than 6 Mets of activity without any shortness of breath or chest pain. He has never had an abdominal operation before  HPI  Past Medical History:  Diagnosis Date  . Gallstones   . Thyroid disease    hypothyroidism    History reviewed. No pertinent surgical history.  No family history on file.  Social History Social History  Substance Use Topics  . Smoking status: Never Smoker  . Smokeless tobacco: Never Used  . Alcohol use No    No Known Allergies  Current Facility-Administered Medications  Medication Dose Route Frequency Provider Last Rate Last Dose  . dextrose 5 % in lactated ringers infusion   Intravenous Continuous Brittney Johnston F Brittney Zellers, MD      . diphenhydrAMINE (BENADRYL) 12.5 MG/5ML elixir 12.5 mg  12.5 mg Oral Q6H PRN Brittney Johnston F Kharee Lesesne, MD       Or  . diphenhydrAMINE (BENADRYL) injection 12.5 mg  12.5 mg Intravenous Q6H PRN Brittney Ro, MD      . Melene Muller ON  02/08/2017] enoxaparin (LOVENOX) injection 40 mg  40 mg Subcutaneous Q24H Brittney Johnston F Williemae Muriel, MD      . HYDROmorphone (DILAUDID) injection 0.5 mg  0.5 mg Intravenous Q2H PRN Brittney Johnston F Panayiotis [place], MD      . ketorolac (TORADOL) 30 MG/ML injection 30 mg  30 mg Intravenous Q6H PRN Brittney Johnston F Dody Smartt, MD      . ondansetron (ZOFRAN-ODT) disintegrating tablet 4 mg  4 mg Oral Q6H PRN Brittney Johnston F Adalin Vanderploeg, MD       Or  . ondansetron (ZOFRAN) injection 4 mg  4 mg Intravenous Q6H PRN Brittney Johnston F Anatalia Kronk, MD      . pantoprazole (PROTONIX) injection 40 mg  40 mg Intravenous Q12H Brittney Johnston F March Steyer, MD      . piperacillin-tazobactam (ZOSYN) IVPB 3.375 g  3.375 g Intravenous Q8H Brittney Johnston Ronnette Juniper, MD       Current Outpatient Prescriptions  Medication Sig Dispense Refill  . cholecalciferol (VITAMIN D) 1000 units tablet Take 1,000 Units by mouth daily.    . CRYSELLE-28 0.3-30 MG-MCG tablet Take 1 tablet by mouth daily.    Brittney Johnston Kitchen levothyroxine (SYNTHROID, LEVOTHROID) 112 MCG tablet Take 112 mcg by mouth daily.    . Multiple Vitamin (MULTIVITAMIN WITH MINERALS) TABS tablet Take 1 tablet by mouth daily.       Review of Systems A 10 point review of systems was asked and was negative except for the information  on the HPI  Physical Exam Blood pressure 110/66, pulse 60, temperature 97.7 F (36.5 C), temperature source Oral, resp. rate 16, height 5\' 5"  (1.651 m), weight 74.8 kg (165 lb), last menstrual period 01/16/2017, SpO2 99 %. CONSTITUTIONAL: Very nice female in no acute distress. EYES: Pupils are equal, round, and reactive to light, Sclera are icteric. EARS, NOSE, MOUTH AND THROAT: The oropharynx is clear. The oral mucosa is pink and moist. Hearing is intact to voice. LYMPH NODES:  Lymph nodes in the neck are normal. RESPIRATORY:  Lungs are clear. There is normal respiratory effort, with equal breath sounds bilaterally, and without pathologic use of accessory muscles. CARDIOVASCULAR: Heart is regular without murmurs, gallops, or rubs. GI: The abdomen  is  soft, and the patient in the epigastric area and right upper quadrant. Positive Murphy sign. Release scars GU: Rectal deferred.   MUSCULOSKELETAL: Normal muscle strength and tone. No cyanosis or edema.   SKIN: Turgor is good and there are no pathologic skin lesions or ulcers. NEUROLOGIC: Motor and sensation is grossly normal. Cranial nerves are grossly intact. PSYCH:  Oriented to person, place and time. Affect is normal.  Data Reviewed  ersonally reviewed the patient's imaging, laboratory findings and medical records.    Assessment/Plan Acute cholecystitis and choledocholithiasis and obstructive jaundice. She is in need for admission for IV antibiotics ,IV fluids, will obtain a right upper quadrant ultrasound and asked GI for ERCP Dr. Servando SnareWohl is on call later today and we will call him first thing in the morning. Discussed with the patient in detail about the rationale for ERCP and all disease she will need a laparoscopic cholecystectomy. Discussed with her about the procedure the risks benefits and possible complications including but not limited to: Bleeding, common bile duct injury, conversion to open, chronic pain. She understands and agrees with the plan.  No evidence of cholangitis and No need for emergent surgical intervention at this time.      Sterling Bigiego Rasheedah Reis, MD FACS General Surgeon 02/07/2017, 12:51 AM

## 2017-02-08 ENCOUNTER — Inpatient Hospital Stay: Payer: Managed Care, Other (non HMO) | Admitting: Certified Registered Nurse Anesthetist

## 2017-02-08 ENCOUNTER — Encounter: Payer: Self-pay | Admitting: Gastroenterology

## 2017-02-08 ENCOUNTER — Inpatient Hospital Stay: Payer: Managed Care, Other (non HMO)

## 2017-02-08 ENCOUNTER — Encounter
Admission: EM | Disposition: A | Discharge status: Home / Self Care | Payer: Self-pay | Source: Home / Self Care | Attending: Surgery

## 2017-02-08 DIAGNOSIS — K8041 Calculus of bile duct with cholecystitis, unspecified, with obstruction: Secondary | ICD-10-CM

## 2017-02-08 HISTORY — PX: LAPAROSCOPIC CHOLECYSTECTOMY: SUR755

## 2017-02-08 HISTORY — PX: CHOLECYSTECTOMY: SHX55

## 2017-02-08 LAB — COMPREHENSIVE METABOLIC PANEL
ALT: 317 U/L — AB (ref 14–54)
ANION GAP: 8 (ref 5–15)
AST: 204 U/L — ABNORMAL HIGH (ref 15–41)
Albumin: 3.3 g/dL — ABNORMAL LOW (ref 3.5–5.0)
Alkaline Phosphatase: 189 U/L — ABNORMAL HIGH (ref 38–126)
BUN: <5 mg/dL — ABNORMAL LOW (ref 6–20)
CHLORIDE: 108 mmol/L (ref 101–111)
CO2: 23 mmol/L (ref 22–32)
Calcium: 9 mg/dL (ref 8.9–10.3)
Creatinine, Ser: 0.52 mg/dL (ref 0.44–1.00)
Glucose, Bld: 164 mg/dL — ABNORMAL HIGH (ref 65–99)
POTASSIUM: 4.3 mmol/L (ref 3.5–5.1)
SODIUM: 139 mmol/L (ref 135–145)
Total Bilirubin: 3.5 mg/dL — ABNORMAL HIGH (ref 0.3–1.2)
Total Protein: 6.3 g/dL — ABNORMAL LOW (ref 6.5–8.1)

## 2017-02-08 LAB — CBC
HCT: 36.7 % (ref 35.0–47.0)
HEMOGLOBIN: 12.6 g/dL (ref 12.0–16.0)
MCH: 34.4 pg — AB (ref 26.0–34.0)
MCHC: 34.5 g/dL (ref 32.0–36.0)
MCV: 99.7 fL (ref 80.0–100.0)
PLATELETS: 239 10*3/uL (ref 150–440)
RBC: 3.68 MIL/uL — AB (ref 3.80–5.20)
RDW: 12.3 % (ref 11.5–14.5)
WBC: 7.7 10*3/uL (ref 3.6–11.0)

## 2017-02-08 LAB — HIV ANTIBODY (ROUTINE TESTING W REFLEX): HIV Screen 4th Generation wRfx: NONREACTIVE

## 2017-02-08 LAB — MRSA PCR SCREENING: MRSA by PCR: NEGATIVE

## 2017-02-08 SURGERY — LAPAROSCOPIC CHOLECYSTECTOMY WITH INTRAOPERATIVE CHOLANGIOGRAM
Anesthesia: General | Site: Abdomen | Wound class: Clean Contaminated

## 2017-02-08 MED ORDER — SUGAMMADEX SODIUM 200 MG/2ML IV SOLN
INTRAVENOUS | Status: DC | PRN
  Administered 2017-02-08: 157 mg via INTRAVENOUS

## 2017-02-08 MED ORDER — FENTANYL CITRATE (PF) 100 MCG/2ML IJ SOLN
INTRAMUSCULAR | Status: AC
  Filled 2017-02-08: qty 2

## 2017-02-08 MED ORDER — SEVOFLURANE IN SOLN
RESPIRATORY_TRACT | Status: AC
  Filled 2017-02-08: qty 250

## 2017-02-08 MED ORDER — PROMETHAZINE HCL 25 MG/ML IJ SOLN
INTRAMUSCULAR | Status: AC
  Administered 2017-02-08: 6.25 mg via INTRAVENOUS
  Filled 2017-02-08: qty 1

## 2017-02-08 MED ORDER — LIDOCAINE HCL 1 % IJ SOLN
INTRAMUSCULAR | Status: DC | PRN
  Administered 2017-02-08: 30 mL

## 2017-02-08 MED ORDER — GLYCOPYRROLATE 0.2 MG/ML IJ SOLN
0.2000 mg | Freq: Once | INTRAMUSCULAR | Status: AC
  Administered 2017-02-08: 0.2 mg via INTRAVENOUS

## 2017-02-08 MED ORDER — ROCURONIUM BROMIDE 50 MG/5ML IV SOLN
INTRAVENOUS | Status: AC
  Filled 2017-02-08: qty 1

## 2017-02-08 MED ORDER — IOTHALAMATE MEGLUMINE 60 % INJ SOLN
INTRAMUSCULAR | Status: DC | PRN
  Administered 2017-02-08: 3 mL

## 2017-02-08 MED ORDER — PROPOFOL 10 MG/ML IV BOLUS
INTRAVENOUS | Status: AC
  Filled 2017-02-08: qty 20

## 2017-02-08 MED ORDER — ACETAMINOPHEN 10 MG/ML IV SOLN
INTRAVENOUS | Status: DC | PRN
  Administered 2017-02-08: 1000 mg via INTRAVENOUS

## 2017-02-08 MED ORDER — SODIUM CHLORIDE 0.9 % IJ SOLN
INTRAMUSCULAR | Status: AC
  Filled 2017-02-08: qty 10

## 2017-02-08 MED ORDER — FENTANYL CITRATE (PF) 100 MCG/2ML IJ SOLN
INTRAMUSCULAR | Status: DC | PRN
  Administered 2017-02-08: 100 ug via INTRAVENOUS
  Administered 2017-02-08 (×2): 50 ug via INTRAVENOUS

## 2017-02-08 MED ORDER — DEXAMETHASONE SODIUM PHOSPHATE 10 MG/ML IJ SOLN
INTRAMUSCULAR | Status: AC
  Filled 2017-02-08: qty 1

## 2017-02-08 MED ORDER — FENTANYL CITRATE (PF) 100 MCG/2ML IJ SOLN
INTRAMUSCULAR | Status: AC
  Administered 2017-02-08: 25 ug via INTRAVENOUS
  Filled 2017-02-08: qty 2

## 2017-02-08 MED ORDER — LIDOCAINE HCL (PF) 2 % IJ SOLN
INTRAMUSCULAR | Status: AC
  Filled 2017-02-08: qty 2

## 2017-02-08 MED ORDER — HYDROCODONE-ACETAMINOPHEN 5-325 MG PO TABS
1.0000 | ORAL_TABLET | ORAL | Status: DC | PRN
  Administered 2017-02-08 – 2017-02-09 (×3): 2 via ORAL
  Administered 2017-02-09 – 2017-02-10 (×2): 1 via ORAL
  Administered 2017-02-10: 2 via ORAL
  Administered 2017-02-10 – 2017-02-12 (×7): 1 via ORAL
  Filled 2017-02-08 (×4): qty 1
  Filled 2017-02-08: qty 2
  Filled 2017-02-08 (×2): qty 1
  Filled 2017-02-08 (×3): qty 2
  Filled 2017-02-08: qty 1
  Filled 2017-02-08 (×2): qty 2
  Filled 2017-02-08: qty 1

## 2017-02-08 MED ORDER — MIDAZOLAM HCL 2 MG/2ML IJ SOLN
INTRAMUSCULAR | Status: AC
  Filled 2017-02-08: qty 2

## 2017-02-08 MED ORDER — PROMETHAZINE HCL 25 MG/ML IJ SOLN
6.2500 mg | INTRAMUSCULAR | Status: DC | PRN
  Administered 2017-02-08: 6.25 mg via INTRAVENOUS

## 2017-02-08 MED ORDER — FENTANYL CITRATE (PF) 100 MCG/2ML IJ SOLN
25.0000 ug | INTRAMUSCULAR | Status: DC | PRN
  Administered 2017-02-08 (×4): 25 ug via INTRAVENOUS

## 2017-02-08 MED ORDER — LIDOCAINE HCL (CARDIAC) 20 MG/ML IV SOLN
INTRAVENOUS | Status: DC | PRN
  Administered 2017-02-08: 50 mg via INTRAVENOUS

## 2017-02-08 MED ORDER — MIDAZOLAM HCL 2 MG/2ML IJ SOLN
INTRAMUSCULAR | Status: DC | PRN
  Administered 2017-02-08: 2 mg via INTRAVENOUS

## 2017-02-08 MED ORDER — SUGAMMADEX SODIUM 200 MG/2ML IV SOLN
INTRAVENOUS | Status: AC
  Filled 2017-02-08: qty 2

## 2017-02-08 MED ORDER — LACTATED RINGERS IV SOLN
INTRAVENOUS | Status: DC
  Administered 2017-02-08 – 2017-02-09 (×4): via INTRAVENOUS

## 2017-02-08 MED ORDER — ONDANSETRON HCL 4 MG/2ML IJ SOLN
INTRAMUSCULAR | Status: AC
  Filled 2017-02-08: qty 2

## 2017-02-08 MED ORDER — DEXAMETHASONE SODIUM PHOSPHATE 10 MG/ML IJ SOLN
INTRAMUSCULAR | Status: DC | PRN
  Administered 2017-02-08: 10 mg via INTRAVENOUS

## 2017-02-08 MED ORDER — LACTATED RINGERS IV SOLN
INTRAVENOUS | Status: DC | PRN
  Administered 2017-02-08: 13:00:00 via INTRAVENOUS

## 2017-02-08 MED ORDER — GLYCOPYRROLATE 0.2 MG/ML IJ SOLN
INTRAMUSCULAR | Status: AC
  Administered 2017-02-08: 0.2 mg via INTRAVENOUS
  Filled 2017-02-08: qty 1

## 2017-02-08 MED ORDER — ROCURONIUM BROMIDE 100 MG/10ML IV SOLN
INTRAVENOUS | Status: DC | PRN
  Administered 2017-02-08 (×2): 10 mg via INTRAVENOUS
  Administered 2017-02-08: 20 mg via INTRAVENOUS
  Administered 2017-02-08: 10 mg via INTRAVENOUS
  Administered 2017-02-08: 20 mg via INTRAVENOUS
  Administered 2017-02-08: 30 mg via INTRAVENOUS

## 2017-02-08 MED ORDER — PROPOFOL 10 MG/ML IV BOLUS
INTRAVENOUS | Status: DC | PRN
  Administered 2017-02-08: 150 mg via INTRAVENOUS

## 2017-02-08 MED ORDER — ACETAMINOPHEN 10 MG/ML IV SOLN
INTRAVENOUS | Status: AC
  Filled 2017-02-08: qty 100

## 2017-02-08 SURGICAL SUPPLY — 50 items
ADHESIVE MASTISOL STRL (MISCELLANEOUS) ×4 IMPLANT
APPLIER CLIP ROT 10 11.4 M/L (STAPLE) ×4
BAG COUNTER SPONGE EZ (MISCELLANEOUS) ×3 IMPLANT
BLADE SURG SZ11 CARB STEEL (BLADE) ×4 IMPLANT
BULB RESERV EVAC DRAIN JP 100C (MISCELLANEOUS) ×4 IMPLANT
CANISTER SUCT 1200ML W/VALVE (MISCELLANEOUS) ×4 IMPLANT
CATH CHOLANG 76X19 KUMAR (CATHETERS) ×4 IMPLANT
CHLORAPREP W/TINT 26ML (MISCELLANEOUS) ×4 IMPLANT
CLIP APPLIE ROT 10 11.4 M/L (STAPLE) ×2 IMPLANT
CLOSURE WOUND 1/2 X4 (GAUZE/BANDAGES/DRESSINGS) ×1
CONRAY 60ML FOR OR (MISCELLANEOUS) ×4 IMPLANT
COUNTER SPONGE BAG EZ (MISCELLANEOUS) ×1
DRAIN CHANNEL JP 19F (MISCELLANEOUS) ×4 IMPLANT
DRAPE SHEET LG 3/4 BI-LAMINATE (DRAPES) ×4 IMPLANT
DRESSING TELFA 4X3 1S ST N-ADH (GAUZE/BANDAGES/DRESSINGS) ×4 IMPLANT
DRSG TEGADERM 2-3/8X2-3/4 SM (GAUZE/BANDAGES/DRESSINGS) ×16 IMPLANT
ELECT REM PT RETURN 9FT ADLT (ELECTROSURGICAL) ×4
ELECTRODE REM PT RTRN 9FT ADLT (ELECTROSURGICAL) ×2 IMPLANT
ENDOLOOP SUT PDS II  0 18 (SUTURE) ×2
ENDOLOOP SUT PDS II 0 18 (SUTURE) ×2 IMPLANT
GLOVE BIO SURGEON STRL SZ7.5 (GLOVE) ×4 IMPLANT
GLOVE INDICATOR 8.0 STRL GRN (GLOVE) ×4 IMPLANT
GOWN STRL REUS W/ TWL LRG LVL3 (GOWN DISPOSABLE) ×6 IMPLANT
GOWN STRL REUS W/TWL LRG LVL3 (GOWN DISPOSABLE) ×6
GRASPER SUT TROCAR 14GX15 (MISCELLANEOUS) ×4 IMPLANT
IRRIGATION STRYKERFLOW (MISCELLANEOUS) ×2 IMPLANT
IRRIGATOR STRYKERFLOW (MISCELLANEOUS) ×4
IV NS 1000ML (IV SOLUTION) ×2
IV NS 1000ML BAXH (IV SOLUTION) ×2 IMPLANT
L-HOOK LAP DISP 36CM (ELECTROSURGICAL) ×4
LABEL OR SOLS (LABEL) ×4 IMPLANT
LHOOK LAP DISP 36CM (ELECTROSURGICAL) ×2 IMPLANT
NEEDLE HYPO 25X1 1.5 SAFETY (NEEDLE) ×4 IMPLANT
NEEDLE VERESS 14GA 120MM (NEEDLE) ×4 IMPLANT
NS IRRIG 500ML POUR BTL (IV SOLUTION) ×4 IMPLANT
PACK LAP CHOLECYSTECTOMY (MISCELLANEOUS) ×4 IMPLANT
PENCIL ELECTRO HAND CTR (MISCELLANEOUS) ×4 IMPLANT
POUCH ENDO CATCH 10MM SPEC (MISCELLANEOUS) IMPLANT
SCISSORS METZENBAUM CVD 33 (INSTRUMENTS) ×4 IMPLANT
SLEEVE ENDOPATH XCEL 5M (ENDOMECHANICALS) ×8 IMPLANT
SPONGE DRAIN TRACH 4X4 STRL 2S (GAUZE/BANDAGES/DRESSINGS) ×4 IMPLANT
STRIP CLOSURE SKIN 1/2X4 (GAUZE/BANDAGES/DRESSINGS) ×3 IMPLANT
SUT ETHILON 3-0 FS-10 30 BLK (SUTURE) ×4
SUT MNCRL 4-0 (SUTURE) ×2
SUT MNCRL 4-0 27XMFL (SUTURE) ×2
SUTURE EHLN 3-0 FS-10 30 BLK (SUTURE) ×2 IMPLANT
SUTURE MNCRL 4-0 27XMF (SUTURE) ×2 IMPLANT
TROCAR XCEL 12X100 BLDLESS (ENDOMECHANICALS) ×4 IMPLANT
TROCAR XCEL NON-BLD 5MMX100MML (ENDOMECHANICALS) ×4 IMPLANT
TUBING INSUFFLATOR HI FLOW (MISCELLANEOUS) ×4 IMPLANT

## 2017-02-08 NOTE — Brief Op Note (Signed)
02/06/2017 - 02/08/2017  4:22 PM  PATIENT:  Greig RightEmily Ramanathan  41 y.o. female  PRE-OPERATIVE DIAGNOSIS:  cholecystitis and choledocholithiasis  POST-OPERATIVE DIAGNOSIS:  cholecystitis and choledocholithiasis  PROCEDURE:  Procedure(s): LAPAROSCOPIC CHOLECYSTECTOMY WITH INTRAOPERATIVE CHOLANGIOGRAM (N/A)  SURGEON:  Surgeon(s) and Role:    * Ricarda Frameharles Malika Demario, MD - Primary  PHYSICIAN ASSISTANT:   ASSISTANTS: none   ANESTHESIA:   general  EBL:  Total I/O In: 500 [I.V.:500] Out: 1000 [Urine:1000]  BLOOD ADMINISTERED:none  DRAINS: (7519fr) Blake drain(s) in the perihepatic region   LOCAL MEDICATIONS USED:  MARCAINE   , XYLOCAINE  and Amount: 30 ml  SPECIMEN:  Source of Specimen:  gallbladder  DISPOSITION OF SPECIMEN:  PATHOLOGY  COUNTS:  YES  TOURNIQUET:  * No tourniquets in log *  DICTATION: .Dragon Dictation  PLAN OF CARE: return to inpatient  PATIENT DISPOSITION:  PACU - hemodynamically stable.   Delay start of Pharmacological VTE agent (>24hrs) due to surgical blood loss or risk of bleeding: yes

## 2017-02-08 NOTE — Progress Notes (Signed)
02/08/2017  Subjective: Patient is POD#0 s/p lap cholecystectomy with IOC for acute cholecystitis with choledocholithiasis.  Patient reports she had shoulder pain earlier tonight but responded well to the pain medication.  Tolerated clears without nausea.  Reports soreness over the incisions.  Has ambulated already.  Vital signs: Temp:  [97.5 F (36.4 C)-98.8 F (37.1 C)] 97.5 F (36.4 C) (03/02 1943) Pulse Rate:  [41-82] 43 (03/02 1943) Resp:  [12-20] 20 (03/02 1943) BP: (96-122)/(38-72) 118/62 (03/02 1943) SpO2:  [91 %-100 %] 99 % (03/02 1943) Weight:  [78.5 kg (173 lb)] 78.5 kg (173 lb) (03/02 1252)   Intake/Output: 03/01 0701 - 03/02 0700 In: [id number] [I.V.:1755; IV Piggyback:100] Out: 3470 [Urine:3470] Last BM Date: 02/05/17  Physical Exam: Constitutional:  No acute distress Abdomen:  Soft, nondistended, appropriately tender to palpation.  Incisions are clean and dry with dressings.  JP drain in place with serosanguinous fluid in bulb, with some drainage around the drain.  Tubing stripped with improved drainage.  New dressing applied.  Labs:   Recent Labs  02/07/17 0451 02/08/17 0633  WBC 5.4 7.7  HGB 12.2 12.6  HCT 34.4* 36.7  PLT 228 239    Recent Labs  02/07/17 0451 02/08/17 0633  NA 138 139  K 3.8 4.3  CL 108 108  CO2 25 23  GLUCOSE 104* 164*  BUN 5* <5*  CREATININE 0.55 0.52  CALCIUM 8.5* 9.0   No results for input(s): LABPROT, INR in the last 72 hours.  Imaging: Dg Cholangiogram Operative  Result Date: 02/08/2017 CLINICAL DATA:  Cholelithiasis, cholecystitis and status post ERCP with removal of common bile duct calculus. EXAM: INTRAOPERATIVE CHOLANGIOGRAM TECHNIQUE: Cholangiographic images from the C-arm fluoroscopic device were submitted for interpretation post-operatively. Please see the procedural report for the amount of contrast and the fluoroscopy time utilized. COMPARISON:  None. FINDINGS: Intraoperative imaging demonstrates numerous gallstones in  the neck of the gallbladder. The cystic duct is open and visualized opacified intrahepatic ducts and common bile duct are normally patent. The common bile duct appears mildly dilated. The distal CBD and duodenum are not visualized. IMPRESSION: Gallstones visualized including stones in the neck of the gallbladder. The cystic duct is open and no filling defects are identified in the visualized common duct. The distal CBD and duodenum are not visualized. Electronically Signed   By: Irish LackGlenn  Yamagata M.D.   On: 02/08/2017 15:46    Assessment/Plan: 41 yo female s/p lap cholecystectomy with IOC.  --continue clears tonight and likely advance in AM --continue IV fluids overnight and d/c in AM --pain control w/ po and iv for breakthrough --monitor JP output. --continue ambulation.   Howie IllJose Luis Krizia Flight, MD [facility]

## 2017-02-08 NOTE — Op Note (Signed)
Laparoscopic Cholecystectomy  Pre-operative Diagnosis: Acute cholecystitis  Post-operative Diagnosis: Acute on chronic cholecystitis  Procedure: Laparoscopic cholecystectomy with intraoperative cholangiogram  Surgeon: Juanda Crumble T. Adonis Huguenin, MD FACS  Anesthesia: Gen. with endotracheal tube  Assistant: None  Procedure Details  The patient was seen again in the Holding Room. The benefits, complications, treatment options, and expected outcomes were discussed with the patient. The risks of bleeding, infection, recurrence of symptoms, failure to resolve symptoms, bile duct damage, bile duct leak, retained common bile duct stone, bowel injury, any of which could require further surgery and/or ERCP, stent, or papillotomy were reviewed with the patient. The likelihood of improving the patient's symptoms with return to their baseline status is good.  The patient and/or family concurred with the proposed plan, giving informed consent.  The patient was taken to Operating Room, identified as Brittney Johnston and the procedure verified as Laparoscopic Cholecystectomy.  A Time Out was held and the above information confirmed.  Prior to the induction of general anesthesia, antibiotic prophylaxis was administered. VTE prophylaxis was in place. General endotracheal anesthesia was then administered and tolerated well. After the induction, the abdomen was prepped with Chloraprep and draped in the sterile fashion. The patient was positioned in the supine position.  Local anesthetic  was injected into the skin near the umbilicus and an incision made. The Veress needle was placed. Pneumoperitoneum was then created with CO2 and tolerated well without any adverse changes in the patient's vital signs. A 66m port was placed in the periumbilical position and the abdominal cavity was explored.  Two 5-mm ports were placed in the right upper quadrant and a 12 mm epigastric port was placed all under direct vision. All skin incisions   were infiltrated with a local anesthetic agent before making the incision and placing the trocars.   The patient was positioned  in reverse Trendelenburg, tilted slightly to the patient's left.  The gallbladder was identified, the fundus grasped and retracted cephalad. The gallbladder was noted to be very dense and difficult to manipulate. Adhesions were lysed bluntly and with electrocautery. The infundibulum was grasped and retracted laterally, exposing the peritoneum overlying the triangle of Calot. Due to the dense inflammation visualization of the duct was unable be clearly identified. Attempts at blunt dissection were met with multiple areas of oozing bleeding.  At this point the decision was made to proceed with a cholangiogram using the Kumar clamp and catheter. Through this a cholangiogram was performed which showed numerous gallstones within the gallbladder and a duct living within the thick inflammation that had previously been being dissected. A clearly showed contrast going into the common duct and refluxing into the right left hepatic. There were no filling defects outside the cystic duct.  At this point the area and the cystic duct was more progressively dissected. It was able to be identified but was noted to be very thick. Attempts to clear off the duct it was inadvertently entered into allowing stones to be retrieved. The area of the duct was clipped above and below the opening and then it was cut in between EndoShears. Due to the size and thickness the proximal end was then secured with a PDS Endoloop. The cystic artery was identified behind this area and serially clipped and cut. The remainder of the attachments from the gallbladder and liver were taken down electrocautery.  The gallbladder was taken from the gallbladder fossa in a retrograde fashion with the electrocautery. The gallbladder was removed and placed in an Endocatch bag. The  liver bed was irrigated and inspected. Hemostasis  was achieved with the electrocautery. Copious irrigation was utilized and was repeatedly aspirated until clear.  The gallbladder and Endocatch sac were then removed through the epigastric port site.   Due to the extreme inflammatory state the decision was made to place a 19 [place] round Blake drain in the abdomen. It was placed in the abdomen through the upper midline and taken out of the abdomen through the most lateral right upper quadrant trocar site. It was visually placed laying under the liver across the area of our gallbladder dissection. It was secured to the skin with a 3-0 nylon suture.  Inspection of the right upper quadrant was performed. No bleeding, bile duct injury or leak, or bowel injury was noted. Pneumoperitoneum was released. 4-0 subcuticular Monocryl was used to close the skin. Steristrips and Mastisol and sterile dressings were  applied.  The patient was then extubated and brought to the recovery room in stable condition. Sponge, lap, and needle counts were correct at closure and at the conclusion of the case.   Findings: Acute on chronic Cholecystitis   Estimated Blood Loss: 100 mL         Drains: 20 French round Blake         Specimens: Gallbladder           Complications: none               Brittney Shuey T. Adonis Huguenin, MD, FACS

## 2017-02-08 NOTE — Anesthesia Procedure Notes (Signed)
Procedure Name: Intubation Date/Time: 02/08/2017 1:42 PM Performed by: Ginger CarneMICHELET, Husain Costabile Pre-anesthesia Checklist: Patient identified, Emergency Drugs available, Suction available, Patient being monitored and Timeout performed Patient Re-evaluated:Patient Re-evaluated prior to inductionOxygen Delivery Method: Circle system utilized Preoxygenation: Pre-oxygenation with 100% oxygen Intubation Type: IV induction Ventilation: Mask ventilation without difficulty Laryngoscope Size: Miller and 2 Grade View: Grade I Tube type: Oral Tube size: 7.0 mm Number of attempts: 1 Airway Equipment and Method: Stylet Placement Confirmation: ETT inserted through vocal cords under direct vision,  positive ETCO2 and breath sounds checked- equal and bilateral Secured at: 20 cm Tube secured with: Tape Dental Injury: Teeth and Oropharynx as per pre-operative assessment

## 2017-02-08 NOTE — Progress Notes (Signed)
CC: Cholecystitis Subjective: Patient reports improvement in her symptoms since ERCP yesterday. He is anxious to have surgery so that she doesn't have another attack.  Objective: Vital signs in last 24 hours: Temp:  [97.4 F (36.3 C)-98.5 F (36.9 C)] 97.8 F (36.6 C) (03/02 0846) Pulse Rate:  [43-101] 49 (03/02 0846) Resp:  [13-20] 18 (03/02 0846) BP: (90-115)/(40-60) 96/60 (03/02 0846) SpO2:  [96 %-100 %] 98 % (03/02 0846) Last BM Date: 02/05/17  Intake/Output from previous day: 03/01 0701 - 03/02 0700 In: 1855 [I.V.:1755; IV Piggyback:100] Out: 3470 [Urine:3470] Intake/Output this shift: Total I/O In: -  Out: 1000 [Urine:1000]  Physical exam:  Gen.: No acute distress Chest: Clear to auscultation Heart: Regular rhythm Abdomen: Soft, nontender, nondistended  Lab Results: CBC   Recent Labs  02/07/17 0451 02/08/17 0633  WBC 5.4 7.7  HGB 12.2 12.6  HCT 34.4* 36.7  PLT 228 239   BMET  Recent Labs  02/07/17 0451 02/08/17 0633  NA 138 139  K 3.8 4.3  CL 108 108  CO2 25 23  GLUCOSE 104* 164*  BUN 5* <5*  CREATININE 0.55 0.52  CALCIUM 8.5* 9.0   PT/INR No results for input(s): LABPROT, INR in the last 72 hours. ABG No results for input(s): PHART, HCO3 in the last 72 hours.  Invalid input(s): PCO2, PO2  Studies/Results: Ct Abdomen Pelvis W Contrast  Addendum Date: 02/06/2017   ADDENDUM REPORT: 02/06/2017 22:01 ADDENDUM: A subtle density in the distal CBD is also noted suspicious for choledocholithiasis. This is best appreciated on the coronal images. Electronically Signed   By: Tollie Eth M.D.   On: 02/06/2017 22:01   Result Date: 02/06/2017 CLINICAL DATA:  Upper abdominal pain between the ribs radiating into the back. EXAM: CT ABDOMEN AND PELVIS WITH CONTRAST TECHNIQUE: Multidetector CT imaging of the abdomen and pelvis was performed using the standard protocol following bolus administration of intravenous contrast. CONTRAST:  ISOVUE-300  IOPAMIDOL (ISOVUE-300) INJECTION 61% COMPARISON:  None. FINDINGS: Lower chest: Small hiatal hernia. Bibasilar dependent atelectasis. Normal size cardiac chambers without pericardial effusion noted. Hepatobiliary: Thick-walled gallbladder measuring up to 5 mm in single wall thickness with pericholecystic fluid and gallstones. Mild periportal edema. No space-occupying mass of the liver. Pancreas: Unremarkable. No pancreatic ductal dilatation or surrounding inflammatory changes. Spleen: Normal in size without focal abnormality. Adrenals/Urinary Tract: Adrenal glands are unremarkable. Kidneys are normal, without renal calculi, focal lesion, or hydronephrosis. Bladder is unremarkable. Stomach/Bowel: Stomach is within normal limits. Appendix appears normal. No evidence of bowel wall thickening, distention, or inflammatory changes. Vascular/Lymphatic: No significant vascular findings are present. No enlarged abdominal or pelvic lymph nodes. Reproductive: Uterus and bilateral adnexa are unremarkable. Other: No abdominal wall hernia or abnormality. No abdominopelvic ascites. Musculoskeletal: No acute or significant osseous findings. IMPRESSION: Findings in keeping with acute cholecystitis with thick-walled gallbladder, pericholecystic fluid and numerous gallstones present. Periportal edema is noted. Electronically Signed: By: Tollie Eth M.D. On: 02/06/2017 21:50   US Abdomen Limited Ruq  Result Date: 02/07/2017 CLINICAL DATA:  Assess acute cholecystitis noted on recent CT. Initial encounter. EXAM: US ABDOMEN LIMITED - RIGHT UPPER QUADRANT COMPARISON:  CT of the abdomen and pelvis performed 02/06/2017 FINDINGS: Gallbladder: Multiple stones are seen dependently within the gallbladder, with diffuse gallbladder wall thickening measuring up to 5 mm, and pericholecystic fluid. A positive ultrasonographic Murphy's sign is elicited. Common bile duct: Diameter: 1.0 cm. A 0.8 cm obstructing stone is noted at the distal common bile  duct. Liver: No focal  lesion identified. Within normal limits in parenchymal echogenicity. IMPRESSION: 1. Acute cholecystitis, with diffuse gallbladder wall thickening, pericholecystic fluid and positive ultrasonographic Murphy's sign. Underlying cholelithiasis noted. 2. Common bile duct dilated to 1.0 cm, with a 0.8 cm obstructing stone at the distal common bile duct. Electronically Signed   By: Roanna RaiderJeffery  Chang M.D.   On: 02/07/2017 01:23    Anti-infectives: Anti-infectives    Start     Dose/Rate Route Frequency Ordered Stop   02/07/17 0100  piperacillin-tazobactam (ZOSYN) IVPB 3.375 g     3.375 g 12.5 mL/hr over 240 Minutes Intravenous Every 8 hours 02/07/17 0049     02/06/17 2200  piperacillin-tazobactam (ZOSYN) IVPB 3.375 g     3.375 g 100 mL/hr over 30 Minutes Intravenous  Once 02/06/17 2156 02/06/17 2240      Assessment/Plan:  [age] year old female admitted with cholecystitis and choledocholithiasis. I discussed the procedure of a laparoscopic cholecystectomy with intraoperative cholangiogram in detail. We discussed the risks and benefits of a laparoscopic cholecystectomy and possible cholangiogram including, but not limited to bleeding, infection, injury to surrounding structures such as the intestine or liver, bile leak, retained gallstones, need to convert to an open procedure, prolonged diarrhea, blood clots such as  DVT, common bile duct injury, anesthesia risks, and possible need for additional procedures.  The likelihood of improvement in symptoms and return to the patient's normal status is good. We discussed the typical post-operative recovery course. Patient voiced understanding and desires to proceed. Plan to proceed to the operating room later today.   Neri Vieyra T. Tonita CongWoodham, MD, [facility]ACS General Surgeon [facility]  Day [facility] [phone]([id number]) [phone] Night [facility] [phone](7p-7a) [phone] 02/08/2017

## 2017-02-08 NOTE — Transfer of Care (Signed)
Immediate Anesthesia Transfer of Care Note  Patient: Brittney Johnston  Procedure(s) Performed: Procedure(s): LAPAROSCOPIC CHOLECYSTECTOMY WITH INTRAOPERATIVE CHOLANGIOGRAM (N/A)  Patient Location: PACU  Anesthesia Type:General  Level of Consciousness: sedated  Airway & Oxygen Therapy: Patient Spontanous Breathing and Patient connected to face mask oxygen  Post-op Assessment: Report given to RN and Post -op Vital signs reviewed and stable  Post vital signs: Reviewed and stable  Last Vitals:  Vitals:   02/08/17 1208 02/08/17 1252  BP: (!) 103/38 (!) 115/49  Pulse: (!) 51 (!) 55  Resp: 18 18  Temp: 36.7 C 36.9 C    Last Pain:  Vitals:   02/08/17 1252  TempSrc: Tympanic  PainSc: 4       Patients Stated Pain Goal: 1 (02/07/17 1550)  Complications: No apparent anesthesia complications

## 2017-02-08 NOTE — Anesthesia Preprocedure Evaluation (Signed)
Anesthesia Evaluation  Patient identified by MRN, date of birth, ID band Patient awake    Reviewed: Allergy & Precautions, NPO status , Patient's Chart, lab work & pertinent test results, reviewed documented beta blocker date and time   History of Anesthesia Complications Negative for: history of anesthetic complications  Airway Mallampati: III  TM Distance: >3 FB     Dental  (+) Chipped   Pulmonary neg pulmonary ROS,           Cardiovascular Exercise Tolerance: Good negative cardio ROS       Neuro/Psych negative neurological ROS  negative psych ROS   GI/Hepatic negative GI ROS, Neg liver ROS,   Endo/Other  neg diabetesHypothyroidism   Renal/GU negative Renal ROS  negative genitourinary   Musculoskeletal   Abdominal   Peds  Hematology negative hematology ROS (+)   Anesthesia Other Findings Past Medical History: No date: Gallstones No date: Thyroid disease     Comment: hypothyroidism   Reproductive/Obstetrics negative OB ROS                             Anesthesia Physical  Anesthesia Plan  ASA: II  Anesthesia Plan: General   Post-op Pain Management:    Induction: Intravenous  Airway Management Planned: Oral ETT  Additional Equipment:   Intra-op Plan:   Post-operative Plan:   Informed Consent: I have reviewed the patients History and Physical, chart, labs and discussed the procedure including the risks, benefits and alternatives for the proposed anesthesia with the patient or authorized representative who has indicated his/her understanding and acceptance.     Plan Discussed with: CRNA  Anesthesia Plan Comments:         Anesthesia Quick Evaluation

## 2017-02-08 NOTE — Anesthesia Post-op Follow-up Note (Cosign Needed)
Anesthesia QCDR form completed.        

## 2017-02-09 LAB — CBC
HEMATOCRIT: 36.6 % (ref 35.0–47.0)
HEMOGLOBIN: 12.5 g/dL (ref 12.0–16.0)
MCH: 34.7 pg — ABNORMAL HIGH (ref 26.0–34.0)
MCHC: 34.2 g/dL (ref 32.0–36.0)
MCV: 101.3 fL — ABNORMAL HIGH (ref 80.0–100.0)
Platelets: 237 10*3/uL (ref 150–440)
RBC: 3.62 MIL/uL — AB (ref 3.80–5.20)
RDW: 12.6 % (ref 11.5–14.5)
WBC: 10.6 10*3/uL (ref 3.6–11.0)

## 2017-02-09 LAB — COMPREHENSIVE METABOLIC PANEL
ALBUMIN: 3.3 g/dL — AB (ref 3.5–5.0)
ALK PHOS: 156 U/L — AB (ref 38–126)
ALT: 306 U/L — ABNORMAL HIGH (ref 14–54)
AST: 139 U/L — AB (ref 15–41)
Anion gap: 7 (ref 5–15)
BILIRUBIN TOTAL: 1.6 mg/dL — AB (ref 0.3–1.2)
BUN: 5 mg/dL — ABNORMAL LOW (ref 6–20)
CALCIUM: 8.8 mg/dL — AB (ref 8.9–10.3)
CO2: 29 mmol/L (ref 22–32)
Chloride: 105 mmol/L (ref 101–111)
Creatinine, Ser: 0.85 mg/dL (ref 0.44–1.00)
GFR calc Af Amer: >60 mL/min (ref >60)
GFR calc non Af Amer: >60 mL/min (ref >60)
GLUCOSE: 96 mg/dL (ref 65–99)
Potassium: 3.9 mmol/L (ref 3.5–5.1)
Sodium: 141 mmol/L (ref 135–145)
TOTAL PROTEIN: 6.1 g/dL — AB (ref 6.5–8.1)

## 2017-02-09 MED ORDER — AMOXICILLIN-POT CLAVULANATE 875-125 MG PO TABS
1.0000 | ORAL_TABLET | Freq: Two times a day (BID) | ORAL | Status: DC
  Administered 2017-02-09 – 2017-02-12 (×7): 1 via ORAL
  Filled 2017-02-09 (×7): qty 1

## 2017-02-09 NOTE — Plan of Care (Signed)
Problem: Safety: Goal: Ability to remain free from injury will improve Outcome: Progressing Talked to patient about being careful when moving around the room

## 2017-02-09 NOTE — Progress Notes (Signed)
1 Day Post-Op   Subjective:  Patient reports that she is sore this morning but states she is improving. She has tolerated a liquid diet without nausea or vomiting. Her JP drain has been leaking around the skin edges but otherwise she is without complaints.  Vital signs in last 24 hours: Temp:  [97.5 F (36.4 C)-98.8 F (37.1 C)] 98.1 F (36.7 C) (03/03 0440) Pulse Rate:  [41-82] 47 (03/03 0440) Resp:  [12-20] 18 (03/03 0440) BP: (94-122)/(38-72) 110/62 (03/03 0440) SpO2:  [91 %-100 %] 96 % (03/03 0440) Weight:  [78.5 kg (173 lb)] 78.5 kg (173 lb) (03/02 1252) Last BM Date: 02/05/17  Intake/Output from previous day: 03/02 0701 - 03/03 0700 In: 2386 [I.V.:2341; IV Piggyback:45] Out: 2740 [Urine:2400; Drains:240; Blood:100]  GI: Abdomen soft, appropriately tender to palpation at incision sites, nondistended. JP drain in the right upper quadrant draining a serosanguineous fluid.  Lab Results:  CBC  Recent Labs  02/08/17 0633 02/09/17 0756  WBC 7.7 10.6  HGB 12.6 12.5  HCT 36.7 36.6  PLT 239 237   CMP     Component Value Date/Time   NA 141 02/09/2017 0756   K 3.9 02/09/2017 0756   CL 105 02/09/2017 0756   CO2 29 02/09/2017 0756   GLUCOSE 96 02/09/2017 0756   BUN 5 (L) 02/09/2017 0756   CREATININE 0.85 02/09/2017 0756   CALCIUM 8.8 (L) 02/09/2017 0756   PROT 6.1 (L) 02/09/2017 0756   ALBUMIN 3.3 (L) 02/09/2017 0756   AST 139 (H) 02/09/2017 0756   ALT 306 (H) 02/09/2017 0756   ALKPHOS 156 (H) 02/09/2017 0756   BILITOT 1.6 (H) 02/09/2017 0756   GFRNONAA >60 02/09/2017 0756   GFRAA >60 02/09/2017 0756   PT/INR No results for input(s): LABPROT, INR in the last 72 hours.  Studies/Results: Dg Cholangiogram Operative  Result Date: 02/08/2017 CLINICAL DATA:  Cholelithiasis, cholecystitis and status post ERCP with removal of common bile duct calculus. EXAM: INTRAOPERATIVE CHOLANGIOGRAM TECHNIQUE: Cholangiographic images from the C-arm fluoroscopic device were submitted  for interpretation post-operatively. Please see the procedural report for the amount of contrast and the fluoroscopy time utilized. COMPARISON:  None. FINDINGS: Intraoperative imaging demonstrates numerous gallstones in the neck of the gallbladder. The cystic duct is open and visualized opacified intrahepatic ducts and common bile duct are normally patent. The common bile duct appears mildly dilated. The distal CBD and duodenum are not visualized. IMPRESSION: Gallstones visualized including stones in the neck of the gallbladder. The cystic duct is open and no filling defects are identified in the visualized common duct. The distal CBD and duodenum are not visualized. Electronically Signed   By: Irish LackGlenn  Yamagata M.D.   On: 02/08/2017 15:46    Assessment/Plan: 41 year old female status post laparoscopic cholecystectomy with intraoperative cholangiogram and drain placement. Was a very difficult surgery. Patient is doing well from a surgical standpoint. Plan to transition to oral antibiotics and oral medications today. Advance diet to regular. Encourage ambulation and incentive spirometer usage. Likely discharge home tomorrow as long as she continues to improve.   Ricarda Frameharles Kyron Schlitt, MD [facility]FACS General Surgeon [facility]  Day [facility] [phone](7a-7p) [phone] Night [facility] [phone](7p-7a) [phone]  02/09/2017

## 2017-02-09 NOTE — Anesthesia Postprocedure Evaluation (Signed)
Anesthesia Post Note  Patient: [name]  Procedure(s) Performed: Procedure(s) (LRB): LAPAROSCOPIC CHOLECYSTECTOMY WITH INTRAOPERATIVE CHOLANGIOGRAM (N/A)  Patient location during evaluation: PACU Anesthesia Type: General Level of consciousness: awake and alert and oriented Pain management: pain level controlled Vital Signs Assessment: post-procedure vital signs reviewed and stable Respiratory status: spontaneous breathing Cardiovascular status: blood pressure returned to baseline Anesthetic complications: no     Last Vitals:  Vitals:   02/09/17 0029 02/09/17 0032  BP:  (!) 101/54  Pulse: (!) 51   Resp:    Temp:      Last Pain:  Vitals:   02/09/17 0045  TempSrc:   PainSc: 3                  Jaina Morin

## 2017-02-10 LAB — COMPREHENSIVE METABOLIC PANEL
ALK PHOS: 141 U/L — AB (ref 38–126)
ALT: 264 U/L — ABNORMAL HIGH (ref 14–54)
AST: 106 U/L — AB (ref 15–41)
Albumin: 3.2 g/dL — ABNORMAL LOW (ref 3.5–5.0)
Anion gap: 4 — ABNORMAL LOW (ref 5–15)
BILIRUBIN TOTAL: 1.4 mg/dL — AB (ref 0.3–1.2)
BUN: 5 mg/dL — AB (ref 6–20)
CALCIUM: 8.3 mg/dL — AB (ref 8.9–10.3)
CO2: 30 mmol/L (ref 22–32)
Chloride: 106 mmol/L (ref 101–111)
Creatinine, Ser: 0.61 mg/dL (ref 0.44–1.00)
GFR calc Af Amer: >60 mL/min (ref >60)
GFR calc non Af Amer: >60 mL/min (ref >60)
Glucose, Bld: 87 mg/dL (ref 65–99)
POTASSIUM: 3.5 mmol/L (ref 3.5–5.1)
Sodium: 140 mmol/L (ref 135–145)
TOTAL PROTEIN: 5.8 g/dL — AB (ref 6.5–8.1)

## 2017-02-10 LAB — CBC
HEMATOCRIT: 34.2 % — AB (ref 35.0–47.0)
HEMOGLOBIN: 11.9 g/dL — AB (ref 12.0–16.0)
MCH: 34.7 pg — ABNORMAL HIGH (ref 26.0–34.0)
MCHC: 34.8 g/dL (ref 32.0–36.0)
MCV: 99.9 fL (ref 80.0–100.0)
Platelets: 205 10*3/uL (ref 150–440)
RBC: 3.42 MIL/uL — ABNORMAL LOW (ref 3.80–5.20)
RDW: 12.2 % (ref 11.5–14.5)
WBC: 5.3 10*3/uL (ref 3.6–11.0)

## 2017-02-10 MED ORDER — POTASSIUM CHLORIDE CRYS ER 20 MEQ PO TBCR
40.0000 meq | EXTENDED_RELEASE_TABLET | Freq: Once | ORAL | Status: AC
Start: 2017-02-10 — End: 2017-02-10
  Administered 2017-02-10: 40 meq via ORAL
  Filled 2017-02-10: qty 2

## 2017-02-10 MED ORDER — HYDROCODONE-ACETAMINOPHEN 5-325 MG PO TABS: 1.0000 | ORAL_TABLET | ORAL | 0 refills | Status: DC | PRN

## 2017-02-10 MED ORDER — CYCLOBENZAPRINE HCL 10 MG PO TABS
5.0000 mg | ORAL_TABLET | Freq: Three times a day (TID) | ORAL | Status: DC | PRN
  Administered 2017-02-10: 5 mg via ORAL
  Filled 2017-02-10: qty 1

## 2017-02-10 MED ORDER — AMOXICILLIN-POT CLAVULANATE 875-125 MG PO TABS: 1.0000 | ORAL_TABLET | Freq: Two times a day (BID) | ORAL | 0 refills | Status: DC

## 2017-02-10 MED ORDER — SIMETHICONE 80 MG PO CHEW
80.0000 mg | CHEWABLE_TABLET | Freq: Four times a day (QID) | ORAL | Status: DC | PRN
  Administered 2017-02-10 – 2017-02-11 (×2): 80 mg via ORAL
  Filled 2017-02-10 (×2): qty 1

## 2017-02-10 MED ORDER — CYCLOBENZAPRINE HCL 5 MG PO TABS: 5.0000 mg | ORAL_TABLET | Freq: Three times a day (TID) | ORAL | 0 refills | Status: DC | PRN

## 2017-02-10 NOTE — Plan of Care (Signed)
Problem: Bowel/Gastric: Goal: Will not experience complications related to bowel motility Outcome: Adequate for Discharge Denies n/v; tolerating Regular diet; up in hall ambulating. States that she is passing flatus.

## 2017-02-10 NOTE — Progress Notes (Signed)
2 Days Post-Op   Subjective:  Patient reports that she is having some soreness on the left side of her abdomen otherwise is without complaints. She's tolerating a diet without nausea or vomiting and has passed gas per rectum.  Vital signs in last 24 hours: Temp:  [98 F (36.7 C)-98.3 F (36.8 C)] 98.3 F (36.8 C) (03/04 0536) Pulse Rate:  [43-55] 43 (03/04 0536) Resp:  [16-20] 20 (03/04 0536) BP: (98-111)/(55-68) 109/55 (03/04 0536) SpO2:  [96 %-98 %] 96 % (03/04 0536) Last BM Date: 02/05/17  Intake/Output from previous day: 03/03 0701 - 03/04 0700 In: 2555 [I.V.:2515] Out: 4850 [Urine:4700; Drains:150]  GI: Abdomen is soft, appropriately tender to palpation at incision sites, nondistended. Incisions are all closed with dressings are clean, dry, intact. JP drain in the right upper quadrant draining a serous and was fluid. No evidence of bile in the drain.  Lab Results:  CBC  Recent Labs  02/09/17 0756 02/10/17 0335  WBC 10.6 5.3  HGB 12.5 11.9*  HCT 36.6 34.2*  PLT 237 205   CMP     Component Value Date/Time   NA 140 02/10/2017 0335   K 3.5 02/10/2017 0335   CL 106 02/10/2017 0335   CO2 30 02/10/2017 0335   GLUCOSE 87 02/10/2017 0335   BUN 5 (L) 02/10/2017 0335   CREATININE 0.61 02/10/2017 0335   CALCIUM 8.3 (L) 02/10/2017 0335   PROT 5.8 (L) 02/10/2017 0335   ALBUMIN 3.2 (L) 02/10/2017 0335   AST 106 (H) 02/10/2017 0335   ALT 264 (H) 02/10/2017 0335   ALKPHOS 141 (H) 02/10/2017 0335   BILITOT 1.4 (H) 02/10/2017 0335   GFRNONAA >60 02/10/2017 0335   GFRAA >60 02/10/2017 0335   PT/INR No results for input(s): LABPROT, INR in the last 72 hours.  Studies/Results: Dg Cholangiogram Operative  Result Date: 02/08/2017 CLINICAL DATA:  Cholelithiasis, cholecystitis and status post ERCP with removal of common bile duct calculus. EXAM: INTRAOPERATIVE CHOLANGIOGRAM TECHNIQUE: Cholangiographic images from the C-arm fluoroscopic device were submitted for interpretation  post-operatively. Please see the procedural report for the amount of contrast and the fluoroscopy time utilized. COMPARISON:  None. FINDINGS: Intraoperative imaging demonstrates numerous gallstones in the neck of the gallbladder. The cystic duct is open and visualized opacified intrahepatic ducts and common bile duct are normally patent. The common bile duct appears mildly dilated. The distal CBD and duodenum are not visualized. IMPRESSION: Gallstones visualized including stones in the neck of the gallbladder. The cystic duct is open and no filling defects are identified in the visualized common duct. The distal CBD and duodenum are not visualized. Electronically Signed   By: Irish LackGlenn  Yamagata M.D.   On: 02/08/2017 15:46    Assessment/Plan: 41 year old female 2 days status post laparoscopic cholecystectomy for acute on chronic cholecystitis. Doing well. Encourage oral intake and incentive spirometer usage. Saline lock IV this morning. Possible discharge home later today.   Ricarda Frameharles Kortnee Bas, MD [facility] General Surgeon [facility]  Day [facility] [phone](7a-7p) [phone] Night [facility] [phone](7p-7a) [phone]  02/10/2017

## 2017-02-10 NOTE — Progress Notes (Signed)
Dr Aleen CampiPiscoya notified dose missed of am potassium. Ordered to give dose now.

## 2017-02-11 ENCOUNTER — Inpatient Hospital Stay: Payer: Managed Care, Other (non HMO)

## 2017-02-11 ENCOUNTER — Encounter: Payer: Self-pay | Admitting: General Surgery

## 2017-02-11 MED ORDER — BISACODYL 10 MG RE SUPP
10.0000 mg | Freq: Once | RECTAL | Status: AC
  Administered 2017-02-11: 10 mg via RECTAL
  Filled 2017-02-11: qty 1

## 2017-02-11 NOTE — Progress Notes (Signed)
Visited patient to consider discharge. Patient continues to complain of left side pain which is postprandial. She is tolerating her diet.  Abdomen is soft wounds are dressed no bile in drain. Left side of the abdomen is tender with out peritoneal signs but more tender than one would expect after cholecystectomy.  We'll obtain abdominal films this afternoon and keep patient overnight again.

## 2017-02-11 NOTE — Progress Notes (Signed)
3 Days Post-Op  Subjective: Status post laparoscopic cholecystectomy. Patient feels better today than she did yesterday. She has not yet had breakfast. No nausea or vomiting.  Objective: Vital signs in last 24 hours: Temp:  [97.7 F (36.5 C)-98.3 F (36.8 C)] 97.7 F (36.5 C) (03/05 0459) Pulse Rate:  [52-55] 52 (03/05 0459) Resp:  [12-19] 17 (03/05 0459) BP: (103-118)/(40-59) 104/48 (03/05 0459) SpO2:  [95 %-96 %] 95 % (03/05 0459) Last BM Date: 02/05/17  Intake/Output from previous day: 03/04 0701 - 03/05 0700 In: 960 [P.O.:960] Out: 3950 [Urine:3850; Drains:100] Intake/Output this shift: Total I/O In: -  Out: 20 [Drains:20]  Physical exam:  Vital signs are reviewed No bile in drain abdomen is soft nontender wounds are dressed. Calves are nontender.  Lab Results: CBC   Recent Labs  02/09/17 0756 02/10/17 0335  WBC 10.6 5.3  HGB 12.5 11.9*  HCT 36.6 34.2*  PLT 237 205   BMET  Recent Labs  02/09/17 0756 02/10/17 0335  NA 141 140  K 3.9 3.5  CL 105 106  CO2 29 30  GLUCOSE 96 87  BUN 5* 5*  CREATININE 0.85 0.61  CALCIUM 8.8* 8.3*   PT/INR No results for input(s): LABPROT, INR in the last 72 hours. ABG No results for input(s): PHART, HCO3 in the last 72 hours.  Invalid input(s): PCO2, PO2  Studies/Results: No results found.  Anti-infectives: Anti-infectives    Start     Dose/Rate Route Frequency Ordered Stop   02/10/17 0000  amoxicillin-clavulanate (AUGMENTIN) 875-125 MG tablet     1 tablet Oral Every 12 hours 02/10/17 1757     02/09/17 1000  amoxicillin-clavulanate (AUGMENTIN) 875-125 MG per tablet 1 tablet     1 tablet Oral Every 12 hours 02/09/17 0853     02/07/17 0100  piperacillin-tazobactam (ZOSYN) IVPB 3.375 g  Status:  Discontinued     3.375 g 12.5 mL/hr over 240 Minutes Intravenous Every 8 hours 02/07/17 0049 02/09/17 0853   02/06/17 2200  piperacillin-tazobactam (ZOSYN) IVPB 3.375 g     3.375 g 100 mL/hr over 30 Minutes Intravenous   Once 02/06/17 2156 02/06/17 2240      Assessment/Plan: s/p Procedure(s): LAPAROSCOPIC CHOLECYSTECTOMY WITH INTRAOPERATIVE CHOLANGIOGRAM   LFTs improving. No bile in drain. Patient is on a regular diet and will make a decision concerning discharge later today if she tolerates that diet.  Lattie Hawichard E Edman Lipsey, MD, FACS  02/11/2017

## 2017-02-12 LAB — CBC WITH DIFFERENTIAL/PLATELET
BASOS ABS: 0.2 10*3/uL — AB (ref 0–0.1)
Basophils Relative: 2 %
EOS ABS: 0 10*3/uL (ref 0–0.7)
EOS PCT: 0 %
HCT: 42.8 % (ref 35.0–47.0)
HEMOGLOBIN: 15 g/dL (ref 12.0–16.0)
LYMPHS PCT: 17 %
Lymphs Abs: 1.3 10*3/uL (ref 1.0–3.6)
MCH: 34.8 pg — ABNORMAL HIGH (ref 26.0–34.0)
MCHC: 35.1 g/dL (ref 32.0–36.0)
MCV: 99 fL (ref 80.0–100.0)
Monocytes Absolute: 0.4 10*3/uL (ref 0.2–0.9)
Monocytes Relative: 5 %
NEUTROS PCT: 76 %
Neutro Abs: 5.9 10*3/uL (ref 1.4–6.5)
PLATELETS: 288 10*3/uL (ref 150–440)
RBC: 4.32 MIL/uL (ref 3.80–5.20)
RDW: 12.5 % (ref 11.5–14.5)
WBC: 7.8 10*3/uL (ref 3.6–11.0)

## 2017-02-12 LAB — COMPREHENSIVE METABOLIC PANEL
ALK PHOS: 187 U/L — AB (ref 38–126)
ALT: 240 U/L — AB (ref 14–54)
AST: 74 U/L — AB (ref 15–41)
Albumin: 3.6 g/dL (ref 3.5–5.0)
Anion gap: 7 (ref 5–15)
BUN: 9 mg/dL (ref 6–20)
CALCIUM: 9.1 mg/dL (ref 8.9–10.3)
CHLORIDE: 104 mmol/L (ref 101–111)
CO2: 26 mmol/L (ref 22–32)
CREATININE: 0.65 mg/dL (ref 0.44–1.00)
GFR calc Af Amer: >60 mL/min (ref >60)
GFR calc non Af Amer: >60 mL/min (ref >60)
GLUCOSE: 99 mg/dL (ref 65–99)
Potassium: 4 mmol/L (ref 3.5–5.1)
SODIUM: 137 mmol/L (ref 135–145)
Total Bilirubin: 1.4 mg/dL — ABNORMAL HIGH (ref 0.3–1.2)
Total Protein: 7 g/dL (ref 6.5–8.1)

## 2017-02-12 LAB — SURGICAL PATHOLOGY

## 2017-02-12 NOTE — Discharge Summary (Signed)
Physician Discharge Summary  Patient ID: Brittney Johnston MRN: [id number] DOB/AGE: March 27, 1976 41 y.o.  Admit date: 02/06/2017 Discharge date: 02/12/2017   Discharge Diagnoses:  Active Problems:   Choledocholithiasis with acute cholecystitis with obstruction   Calculus of common duct   Elevated liver enzymes   Abnormal findings on imaging of biliary tract   Procedures:ERCP and sphincterotomy, or scopic cholecystectomy with cholangiography  Hospital Course: This a patient admitted the hospital with elevated liver function tests and right upper quadrant pain. A workup showing gallstones and probable choledocholithiasis. She was taken for ERCP by the GI consultation and a sphincterotomy was performed. Dr. Tonita CongWoodham then took the patient to the operating room where a gangrenous cholecystitis was identified. A drain was placed.  Currently she is tolerating a regular diet and her pain is managed well her drain is serous only and she is instructed about drain care. She will have the drain removed on Friday morning at 9 AM in our office by Dr. Tonita CongWoodham. She may shower.  Consults: GI  Disposition: Final discharge disposition not confirmed   Allergies as of 02/12/2017   No Known Allergies     Medication List    TAKE these medications   amoxicillin-clavulanate 875-125 MG tablet Commonly known as:  AUGMENTIN Take 1 tablet by mouth every 12 (twelve) hours.   cholecalciferol 1000 units tablet Commonly known as:  VITAMIN D Take 1,000 Units by mouth daily.   CRYSELLE-28 0.3-30 MG-MCG tablet Generic drug:  norgestrel-ethinyl estradiol Take 1 tablet by mouth daily.   cyclobenzaprine 5 MG tablet Commonly known as:  FLEXERIL Take 1 tablet (5 mg total) by mouth 3 (three) times daily as needed for muscle spasms.   HYDROcodone-acetaminophen 5-325 MG tablet Commonly known as:  NORCO/VICODIN Take 1-2 tablets by mouth every 4 (four) hours as needed for moderate pain or severe pain.   levothyroxine 112  MCG tablet Commonly known as:  SYNTHROID, LEVOTHROID Take 112 mcg by mouth daily.   multivitamin with minerals Tabs tablet Take 1 tablet by mouth daily.      Follow-up Information    Ricarda Frameharles Woodham, MD. Go in 3 day(s).   Specialty:  General Surgery Why:  Report to clinic Fri 3/9at 1:45 for a 9:00 follow up appt.  Contact information: [address] [place] [id number] [phone]           Lattie Hawichard E Kanan Sobek, MD, FACS

## 2017-02-12 NOTE — Progress Notes (Signed)
Pt was educated on drain care.

## 2017-02-12 NOTE — Progress Notes (Signed)
IV was removed. Discharge instructions and follow-up appointments  were provided to the pt and husband at bedside. The pt was taken downstairs via wheelchair by NT.  

## 2017-02-12 NOTE — Progress Notes (Signed)
4 Days Post-Op  Subjective: Patient feels much better today no nausea or vomiting minimal abdominal pain. Objective: Vital signs in last 24 hours: Temp:  [97.7 F (36.5 C)-98.2 F (36.8 C)] 97.7 F (36.5 C) (03/06 0455) Pulse Rate:  [63-69] 69 (03/06 0455) Resp:  [12-17] 17 (03/06 0455) BP: (106-117)/(44-64) 107/44 (03/06 0455) SpO2:  [95 %-96 %] 95 % (03/06 0455) Last BM Date: 02/11/17  Intake/Output from previous day: 03/05 0701 - 03/06 0700 In: 240 [P.O.:240] Out: 1300 [Urine:1200; Drains:100] Intake/Output this shift: No intake/output data recorded.  Physical exam:  Wounds are clean no erythema no drainage no bile in drain abdomen is soft and much less tender than yesterday. No peritoneal signs. Calves are nontender.  Lab Results: CBC   Recent Labs  02/10/17 0335 02/12/17 0424  WBC 5.3 7.8  HGB 11.9* 15.0  HCT 34.2* 42.8  PLT 205 288   BMET  Recent Labs  02/10/17 0335 02/12/17 0424  NA 140 137  K 3.5 4.0  CL 106 104  CO2 30 26  GLUCOSE 87 99  BUN 5* 9  CREATININE 0.61 0.65  CALCIUM 8.3* 9.1   PT/INR No results for input(s): LABPROT, INR in the last 72 hours. ABG No results for input(s): PHART, HCO3 in the last 72 hours.  Invalid input(s): PCO2, PO2  Studies/Results: Dg Abd 2 Views  Result Date: 02/11/2017 CLINICAL DATA:  LEFT abdominal pain after cholecystectomy 3 days ago. Drain in place. Evaluate for small bowel obstruction. EXAM: ABDOMEN - 2 VIEW COMPARISON:  CT abdomen and pelvis February 06, 2017 FINDINGS: Bowel gas pattern is nondilated and nonobstructive. Contrast in the colon. No intra-abdominal mass effect or pathologic calcifications. Surgical clips in the included right abdomen compatible with cholecystectomy. Jackson-Pratt drain mid upper abdomen. Soft tissue planes and included osseous structures are nonsuspicious. IMPRESSION: Status post cholecystectomy with surgical drain in place. No bowel obstruction. Residual large bowel enteric  contrast presumably from CT abdomen and pelvis February 06, 2017. Electronically Signed   By: Awilda Metroourtnay  Bloomer M.D.   On: 02/11/2017 14:25    Anti-infectives: Anti-infectives    Start     Dose/Rate Route Frequency Ordered Stop   02/10/17 0000  amoxicillin-clavulanate (AUGMENTIN) 875-125 MG tablet     1 tablet Oral Every 12 hours 02/10/17 1757     02/09/17 1000  amoxicillin-clavulanate (AUGMENTIN) 875-125 MG per tablet 1 tablet     1 tablet Oral Every 12 hours 02/09/17 0853     02/07/17 0100  piperacillin-tazobactam (ZOSYN) IVPB 3.375 g  Status:  Discontinued     3.375 g 12.5 mL/hr over 240 Minutes Intravenous Every 8 hours 02/07/17 0049 02/09/17 0853   02/06/17 2200  piperacillin-tazobactam (ZOSYN) IVPB 3.375 g     3.375 g 100 mL/hr over 30 Minutes Intravenous  Once 02/06/17 2156 02/06/17 2240      Assessment/Plan: s/p Procedure(s): LAPAROSCOPIC CHOLECYSTECTOMY WITH INTRAOPERATIVE CHOLANGIOGRAM   LFTs normalizing. KUB yesterday failed to identify any abnormality other than some constipation and stool in the left colon. Patient doing better today we'll likely be able to go home after breakfast this morning.  Lattie Hawichard E Morgaine Kimball, MD, FACS  02/12/2017

## 2017-02-12 NOTE — Discharge Instructions (Signed)
Laparoscopic Cholecystectomy, Care After °This sheet gives you information about how to care for yourself after your procedure. Your health care provider may also give you more specific instructions. If you have problems or questions, contact your health care provider. °What can I expect after the procedure? °After the procedure, it is common to have: °· Pain at your incision sites. You will be given medicines to control this pain. °· Mild nausea or vomiting. °· Bloating and possible shoulder pain from the air-like gas that was used during the procedure. °Follow these instructions at home: °Incision care  ° °· Follow instructions from your health care provider about how to take care of your incisions. Make sure you: °¨ Wash your hands with soap and water before you change your bandage (dressing). If soap and water are not available, use hand sanitizer. °¨ Change your dressing as told by your health care provider. °¨ Leave stitches (sutures), skin glue, or adhesive strips in place. These skin closures may need to be in place for 2 weeks or longer. If adhesive strip edges start to loosen and curl up, you may trim the loose edges. Do not remove adhesive strips completely unless your health care provider tells you to do that. °· Do not take baths, swim, or use a hot tub until your health care provider approves. Ask your health care provider if you can take showers. You may only be allowed to take sponge baths for bathing. °· Check your incision area every day for signs of infection. Check for: °¨ More redness, swelling, or pain. °¨ More fluid or blood. °¨ Warmth. °¨ Pus or a bad smell. °Activity  °· Do not drive or use heavy machinery while taking prescription pain medicine. °· Do not lift anything that is heavier than 10 lb (4.5 kg) until your health care provider approves. °· Do not play contact sports until your health care provider approves. °· Do not drive for 24 hours if you were given a medicine to help you relax  (sedative). °· Rest as needed. Do not return to work or school until your health care provider approves. °General instructions  °· Take over-the-counter and prescription medicines only as told by your health care provider. °· To prevent or treat constipation while you are taking prescription pain medicine, your health care provider may recommend that you: °¨ Drink enough fluid to keep your urine clear or pale yellow. °¨ Take over-the-counter or prescription medicines. °¨ Eat foods that are high in fiber, such as fresh fruits and vegetables, whole grains, and beans. °¨ Limit foods that are high in fat and processed sugars, such as fried and sweet foods. °Contact a health care provider if: °· You develop a rash. °· You have more redness, swelling, or pain around your incisions. °· You have more fluid or blood coming from your incisions. °· Your incisions feel warm to the touch. °· You have pus or a bad smell coming from your incisions. °· You have a fever. °· One or more of your incisions breaks open. °Get help right away if: °· You have trouble breathing. °· You have chest pain. °· You have increasing pain in your shoulders. °· You faint or feel dizzy when you stand. °· You have severe pain in your abdomen. °· You have nausea or vomiting that lasts for more than one day. °· You have leg pain. °This information is not intended to replace advice given to you by your health care provider. Make sure you discuss any   questions you have with your health care provider. Document Released: 11/26/2005 Document Revised: 06/16/2016 Document Reviewed: 05/14/2016 Elsevier Interactive Patient Education  2017 [employer]  Surgical [facility] Care Surgical drains are used to remove extra fluid that normally builds up in a surgical wound after surgery. A surgical drain helps to heal a surgical wound. Different kinds of surgical drains include:  Active drains. These drains use suction to pull drainage away from the surgical  wound. Drainage flows through a tube to a container outside of the body. It is important to keep the bulb or the drainage container flat (compressed) at all times, except while you empty it. Flattening the bulb or container creates suction. The two most common types of active drains are bulb drains and Hemovac drains.  Passive drains. These drains allow fluid to drain naturally, by gravity. Drainage flows through a tube to a bandage (dressing) or a container outside of the body. Passive drains do not need to be emptied. The most common type of passive drain is the Penrose drain. A drain is placed during surgery. Immediately after surgery, drainage is usually bright red and a little thicker than water. The drainage may gradually turn yellow or pink and become thinner. It is likely that your health care provider will remove the drain when the drainage stops or when the amount decreases to 1-2 Tbsp (15-30 mL) during a 24-hour period. How to care for your surgical drain  Keep the skin around the drain dry and covered with a dressing at all times.  Check your drain area every day for signs of infection. Check for:  More redness, swelling, or pain.  Pus or a bad smell.  Cloudy drainage. Follow instructions from your health care provider about how to take care of your drain and how to change your dressing. Change your dressing at least one time every day. Change it more often if needed to keep the dressing dry. Make sure you: 1. Gather your supplies, including:  Tape.  Germ-free cleaning solution (sterile saline).  Split gauze drain sponge: 4 x 4 inches (10 x 10 cm).  Gauze square: 4 x 4 inches (10 x 10 cm). 2. Wash your hands with soap and water before you change your dressing. If soap and water are not available, use hand sanitizer. 3. Remove the old dressing. Avoid using scissors to do that. 4. Use sterile saline to clean your skin around the drain. 5. Place the tube through the slit in a  drain sponge. Place the drain sponge so that it covers your wound. 6. Place the gauze square or another drain sponge on top of the drain sponge that is on the wound. Make sure the tube is between those layers. 7. Tape the dressing to your skin. 8. If you have an active bulb or Hemovac drain, tape the drainage tube to your skin 1-2 inches (2.5-5 cm) below the place where the tube enters your body. Taping keeps the tube from pulling on any stitches (sutures) that you have. 9. Wash your hands with soap and water. 10. Write down the color of your drainage and how often you change your dressing. How to empty your active bulb or Hemovac drain 1. Make sure that you have a measuring cup that you can empty your drainage into. 2. Wash your hands with soap and water. If soap and water are not available, use hand sanitizer. 3. Gently move your fingers down the tube while squeezing very lightly. This is called stripping  the tube. This clears any drainage, clots, or tissue from the tube.  Do not pull on the tube.  You may need to strip the tube several times every day to keep the tube clear. 4. Open the bulb cap or the drain plug. Do not touch the inside of the cap or the bottom of the plug. 5. Empty all of the drainage into the measuring cup. 6. Compress the bulb or the container and replace the cap or the plug. To compress the bulb or the container, squeeze it firmly in the middle while you close the cap or plug the container. 7. Write down the amount of drainage that you have in each 24-hour period. If you have less than 2 Tbsp (30 mL) of drainage during 24 hours, contact your health care provider. 8. Flush the drainage down the toilet. 9. Wash your hands with soap and water. Contact a health care provider if:  You have more redness, swelling, or pain around your drain area.  The amount of drainage that you have is increasing instead of decreasing.  You have pus or a bad smell coming from your drain  area.  You have a fever.  You have drainage that is cloudy.  There is a sudden stop or a sudden decrease in the amount of drainage that you have.  Your tube falls out.  Your active draindoes not stay compressedafter you empty it. This information is not intended to replace advice given to you by your health care provider. Make sure you discuss any questions you have with your health care provider. Document Released: 11/23/2000 Document Revised: 05/03/2016 Document Reviewed: 06/15/2015 Elsevier Interactive Patient Education  2017 [employer]  Empty drain and record drain output daily

## 2017-02-13 ENCOUNTER — Encounter: Payer: Self-pay | Admitting: General Surgery

## 2017-02-14 ENCOUNTER — Encounter: Payer: Self-pay | Admitting: General Surgery

## 2017-02-14 ENCOUNTER — Ambulatory Visit (INDEPENDENT_AMBULATORY_CARE_PROVIDER_SITE_OTHER): Payer: Managed Care, Other (non HMO) | Admitting: General Surgery

## 2017-02-14 VITALS — BP 96/66 | HR 98 | Temp 97.6°F | Wt 169.0 lb

## 2017-02-14 DIAGNOSIS — Z4889 Encounter for other specified surgical aftercare: Secondary | ICD-10-CM

## 2017-02-14 NOTE — Progress Notes (Signed)
Outpatient Surgical Follow Up  02/14/2017  Brittney Johnston is an 41 y.o. female.   Chief Complaint  Patient presents with  . Routine Post Op    Laparoscopic Cholecystectomy (3/2)- Dr. Tonita CongWoodham (Has JP Drain)    HPI: Patient returns to clinic 1 week status post laparoscopic cholecystectomy for a chronic cholecystitis and choledocholithiasis. Has continued to improve since discharge. Primary complaints of left-sided abdominal pain. Eating well and having bowel function with the assistance of Senokot. She denies any fevers, chills, chest pain, shortness breath. She is having some subjective nausea but is not stopping her from eating or having bowel function. Drain is in place. Patient has a drain in log states it is always been pink.  Past Medical History:  Diagnosis Date  . Abnormal findings on imaging of biliary tract   . Calculus of common duct   . Choledocholithiasis with acute cholecystitis with obstruction 02/07/2017  . Elevated liver enzymes   . Gallstones   . Hypothyroid 09/04/2011  . Obesity 09/04/2011  . Thyroid disease    hypothyroidism    Past Surgical History:  Procedure Laterality Date  . CHOLECYSTECTOMY N/A 02/08/2017   Procedure: LAPAROSCOPIC CHOLECYSTECTOMY WITH INTRAOPERATIVE CHOLANGIOGRAM;  Surgeon: Ricarda Frameharles Berdell Hostetler, MD;  Location: [facility] ORS;  Service: General;  Laterality: N/A;  . ERCP N/A 02/07/2017   Procedure: ENDOSCOPIC RETROGRADE CHOLANGIOPANCREATOGRAPHY (ERCP);  Surgeon: Midge Miniumarren Wohl, MD;  [facility];  Service: Endoscopy;  Laterality: N/A;  . LAPAROSCOPIC CHOLECYSTECTOMY  02/08/2017    Family History  Problem Relation Age of Onset  . Hyperthyroidism Mother   . Hyperthyroidism Father   . Atrial fibrillation Father   . Hodgkin's lymphoma Father     remission    Social History:  reports that she has never smoked. She has never used smokeless tobacco. She reports that she does not drink alcohol or use drugs.  Allergies: No Known Allergies  Medications  reviewed.    ROS A multipoint review of systems was completed. All pertinent positives and negatives are documented in the history of present illness and remainder are negative  BP 96/66   Pulse 98   Temp 97.6 F (36.4 C) (Oral)   Wt 76.7 kg (169 lb)   LMP 01/16/2017 Comment: neg preg test 02/06/17  BMI 27.70 kg/m   Physical Exam Gen.: No acute distress Chest: Clear to auscultation Heart: Regular rhythm Abdomen: Soft, minimally tender to palpation at her incision sites, nondistended. JP drain and placed in the most lateral right upper quadrant with a serous and was drainage. No evidence of bile at all.    No results found for this or any previous visit (from the past 48 hour(s)). No results found.  Assessment/Plan:  1. Aftercare following surgery 41 year old female status post laparoscopic cholecystectomy. Pathology reviewed with the patient. JP drain removed today in clinic without any difficulty. Discussed anticipated wound care as well as continued recovery. Patient will follow-up in clinic next week for 1 additional follow-up after removal of her JP drain. All questions answered to her satisfaction. Provided with note for work through next week.     Ricarda Frameharles Shyana Kulakowski, MD FACS General Surgeon  02/14/2017,9:51 AM

## 2017-02-14 NOTE — Patient Instructions (Signed)
Please call our office with any questions or concerns.  Please do not submerge in a tub, hot tub, or pool until incisions are completely sealed.  Use sun block to incision area over the next year if this area will be exposed to sun. This helps decrease scarring.  You may resume your normal activities on 02/22/2017. At that time- Listen to your body when lifting, if you have pain when lifting, stop and then try again in a few days. Pain after doing exercises or activities of daily living is normal as you get back in to your normal routine. However, you still have a 15 lbs restriction until 03/11/2017.  If you develop redness, drainage, or pain at incision sites- call our office immediately and speak with a nurse.

## 2017-02-21 ENCOUNTER — Encounter: Payer: Self-pay | Admitting: Surgery

## 2017-02-21 ENCOUNTER — Ambulatory Visit (INDEPENDENT_AMBULATORY_CARE_PROVIDER_SITE_OTHER): Payer: Managed Care, Other (non HMO) | Admitting: Surgery

## 2017-02-21 VITALS — BP 108/75 | HR 69 | Temp 97.9°F | Wt 170.0 lb

## 2017-02-21 DIAGNOSIS — Z4889 Encounter for other specified surgical aftercare: Secondary | ICD-10-CM

## 2017-02-21 NOTE — Progress Notes (Signed)
Outpatient postop visit  02/21/2017  Brittney Johnston is an 41 y.o. female.    Procedure: Laparoscopic cholecystectomy  CC: No problems  HPI: This patient status post laparoscopic cholecystectomy she feels well is dressed and working. Nausea vomiting  Medications reviewed.    Physical Exam:  BP 108/75   Pulse 69   Temp 97.9 F (36.6 C) (Oral)   Wt 170 lb (77.1 kg)   BMI 27.86 kg/m     PE: No icterus no jaundice abdomen is soft nontender wounds are clean minimal ecchymosis no drainage    Assessment/Plan:  Patient doing very well recommend follow up on an as-needed basis pathology is reviewed  Lattie Hawichard E Kedrick Mcnamee, MD, FACS

## 2017-02-21 NOTE — Patient Instructions (Addendum)
Please call our office with any questions or concerns.  Please do not submerge in a tub, hot tub, or pool until incisions are completely sealed.  Use sun block to incision area over the next year if this area will be exposed to sun. This helps decrease scarring.  At this time- Listen to your body when lifting, if you have pain when lifting, stop and then try again in a few days. Pain after doing exercises or activities of daily living is normal as you get back in to your normal routine.  If you develop redness, drainage, or pain at incision sites- call our office immediately and speak with a nurse. 

## 2017-03-27 ENCOUNTER — Other Ambulatory Visit: Payer: Self-pay | Admitting: Nurse Practitioner

## 2017-03-27 DIAGNOSIS — Z1231 Encounter for screening mammogram for malignant neoplasm of breast: Secondary | ICD-10-CM

## 2018-05-12 IMAGING — CR DG CHOLANGIOGRAM OPERATIVE
3 series · 15 of 30 positions shown · non-contrast
Comparison: None.

CLINICAL DATA: Cholelithiasis, cholecystitis and status post ERCP
with removal of common bile duct calculus.

EXAM:
INTRAOPERATIVE CHOLANGIOGRAM
TECHNIQUE: Cholangiographic images from the C-arm fluoroscopic device were
submitted for interpretation post-operatively. Please see the
procedural report for the amount of contrast and the fluoroscopy
time utilized.

[Series 3: cont. · 5 of 82 frames shown (1 of 3)]
[frame 10/82]
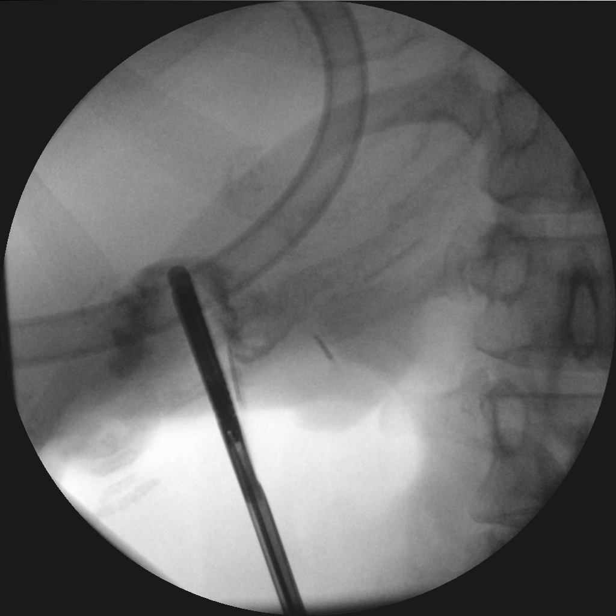
[frame 28/82]
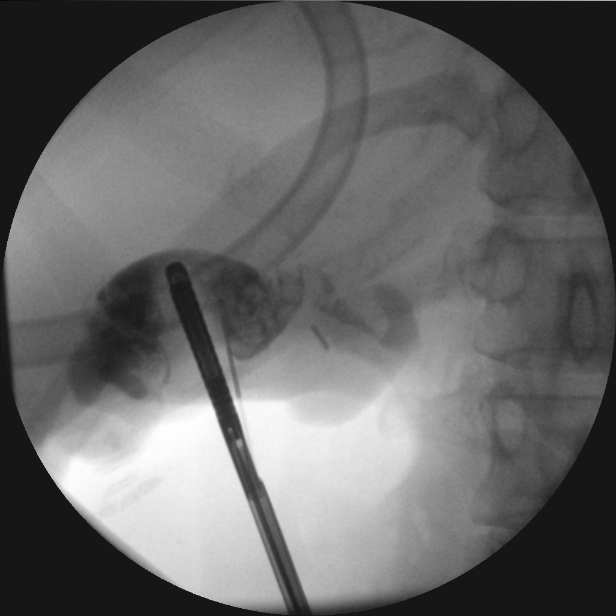
[frame 46/82]
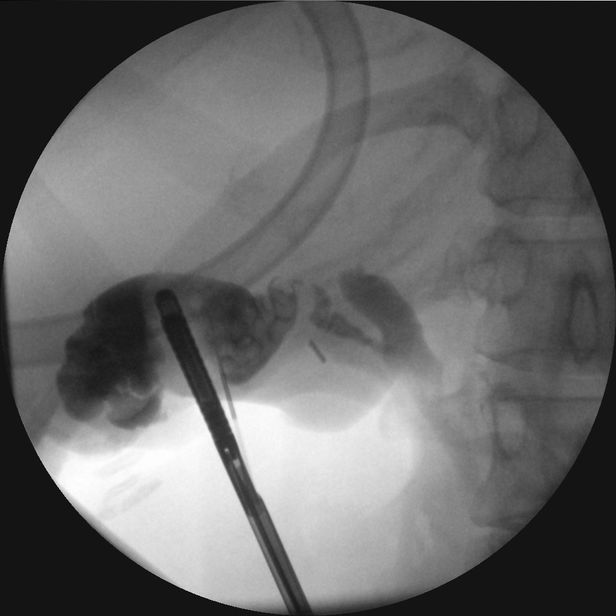
[frame 64/82]
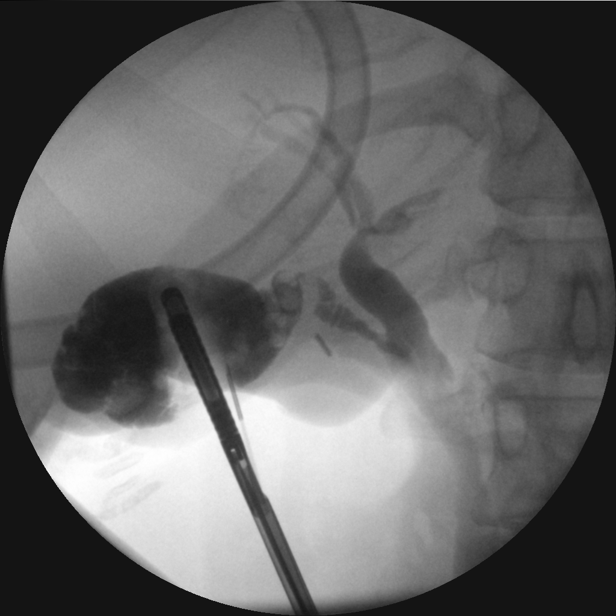
[frame 82/82]
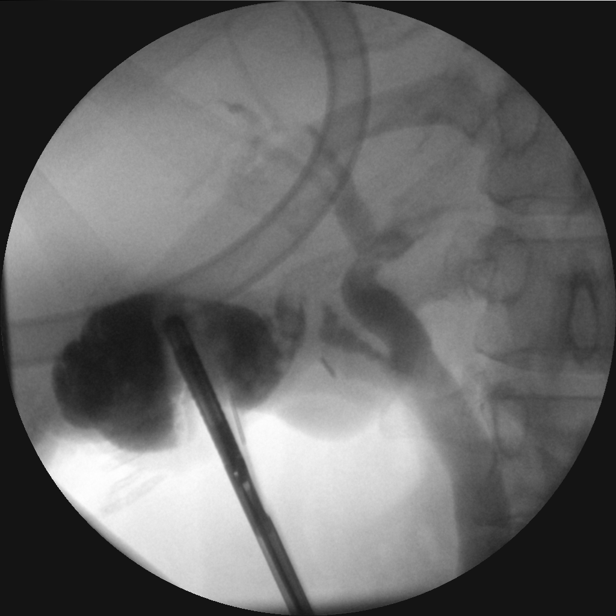

[Series 4: cont. · 5 of 24 frames shown (2 of 3)]
[frame 3/24]
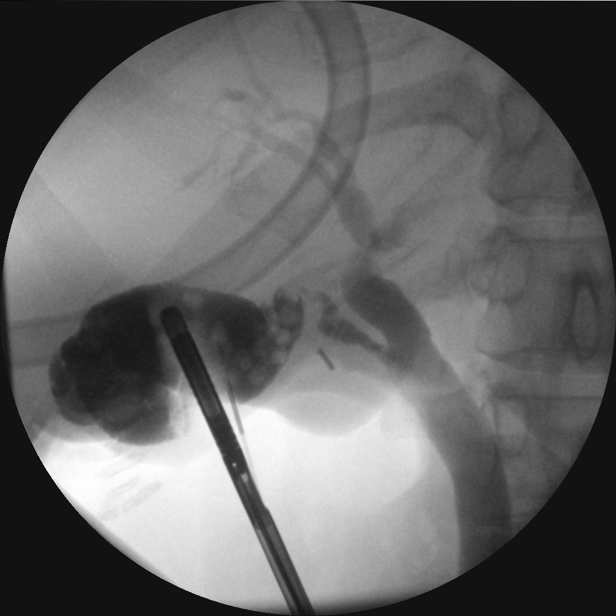
[frame 8/24]
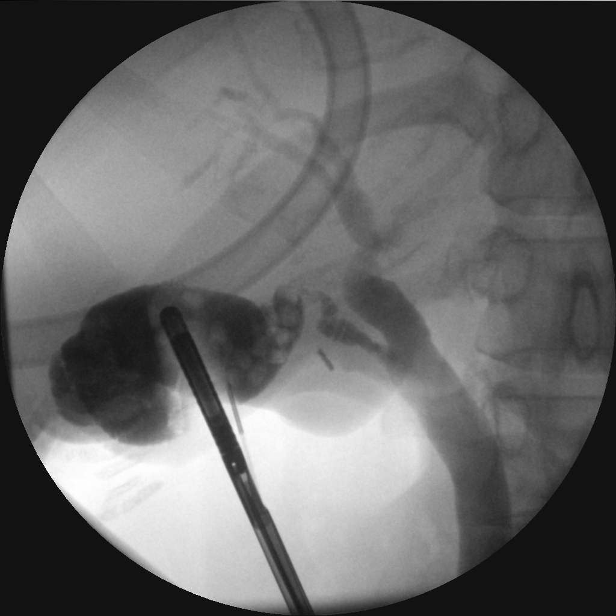
[frame 13/24]
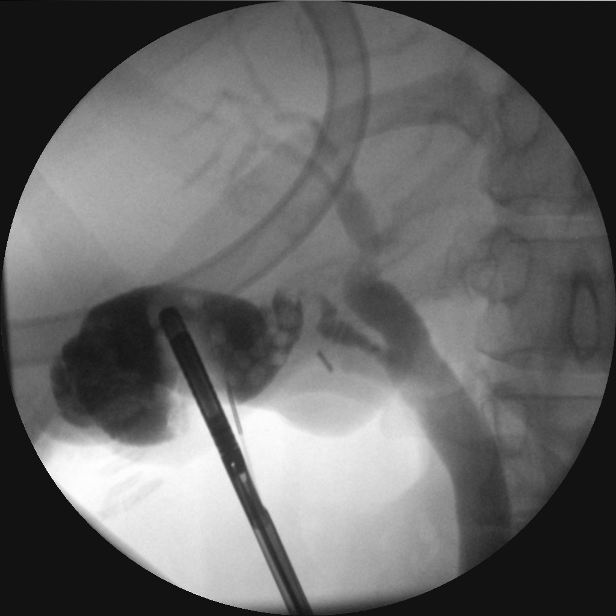
[frame 18/24]
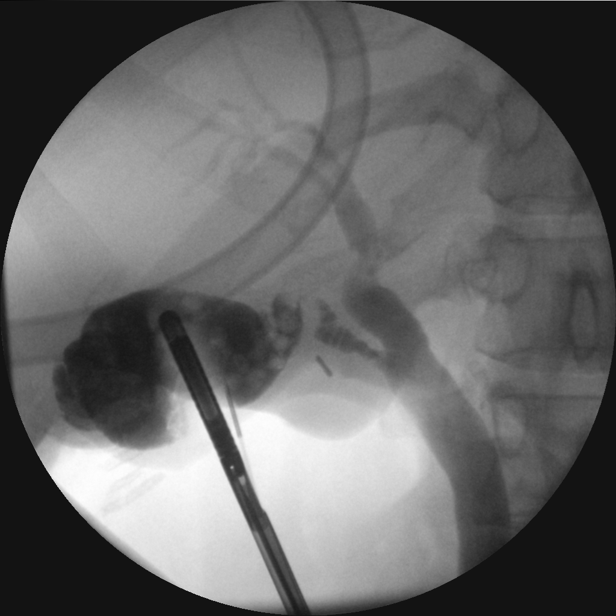
[frame 24/24]
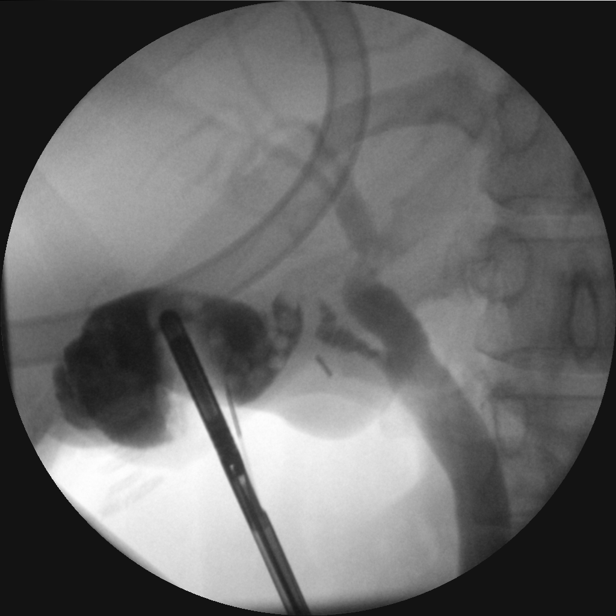

[Series 5: cont. · 5 of 14 frames shown (3 of 3)]
[frame 2/14]
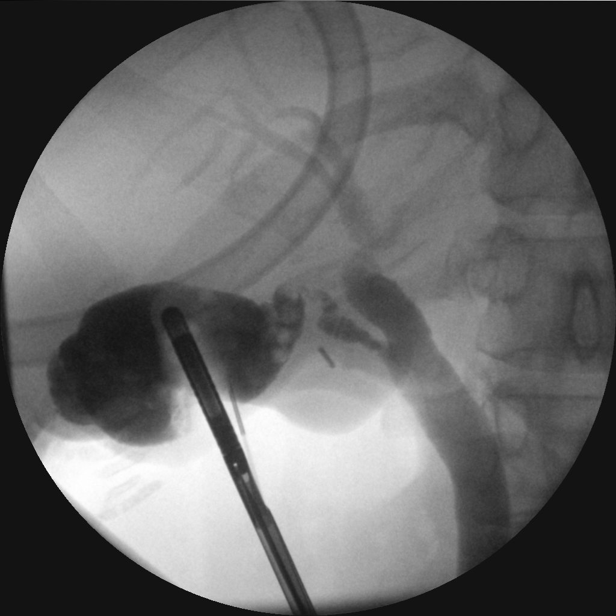
[frame 5/14]
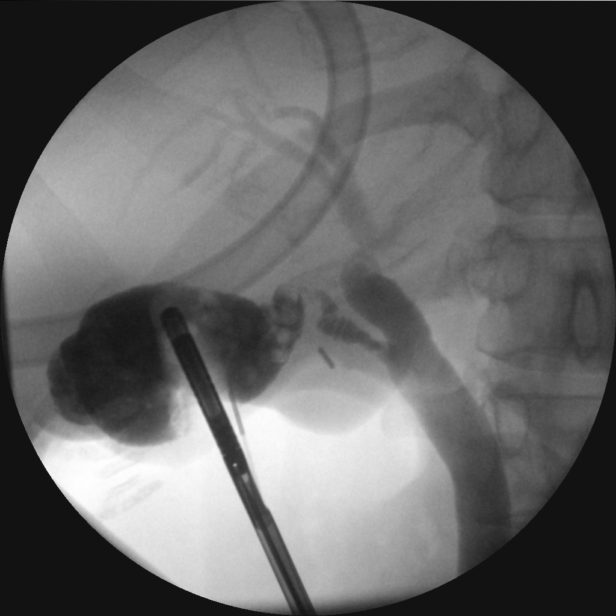
[frame 8/14]
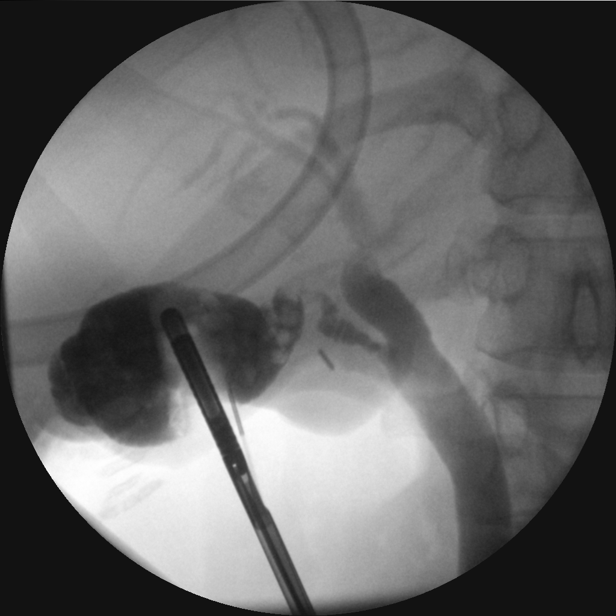
[frame 11/14]
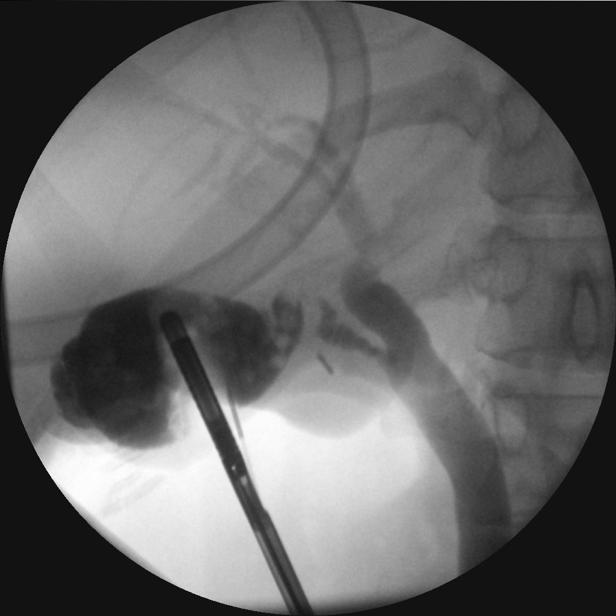
[frame 14/14]
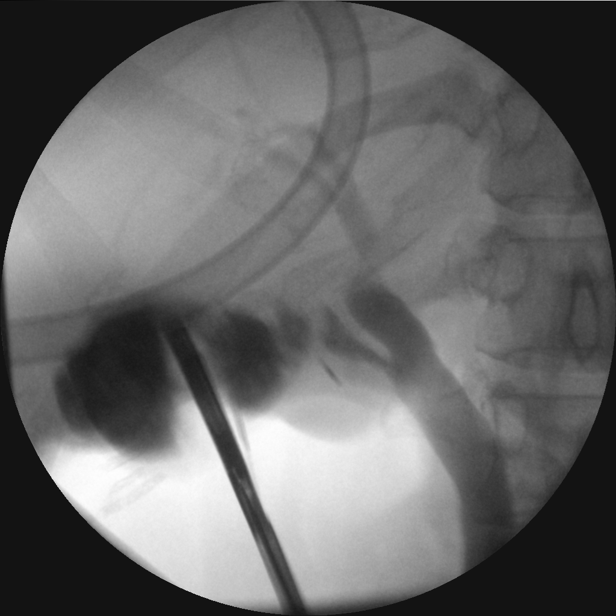

[15 of 30 positions shown; findings below may reference images not displayed]

FINDINGS: Intraoperative imaging demonstrates numerous gallstones in the neck
of the gallbladder. The cystic duct is open and visualized opacified
intrahepatic ducts and common bile duct are normally patent. The
common bile duct appears mildly dilated. The distal CBD and duodenum
are not visualized.
IMPRESSION: Gallstones visualized including stones in the neck of the
gallbladder. The cystic duct is open and no filling defects are
identified in the visualized common duct. The distal CBD and
duodenum are not visualized.

## 2019-09-22 ENCOUNTER — Other Ambulatory Visit: Payer: Self-pay

## 2019-09-22 DIAGNOSIS — Z20822 Contact with and (suspected) exposure to covid-19: Secondary | ICD-10-CM

## 2019-09-24 LAB — NOVEL CORONAVIRUS, NAA: SARS-CoV-2, NAA: NOT DETECTED

## 2021-07-27 ENCOUNTER — Emergency Department
Admission: EM | Admit: 2021-07-27 | Discharge: 2021-07-27 | Disposition: A | Discharge status: Home / Self Care | Payer: 59 | Attending: Emergency Medicine | Admitting: Emergency Medicine

## 2021-07-27 ENCOUNTER — Emergency Department: Payer: 59

## 2021-07-27 ENCOUNTER — Other Ambulatory Visit: Payer: Self-pay

## 2021-07-27 ENCOUNTER — Encounter: Payer: Self-pay | Admitting: *Deleted

## 2021-07-27 DIAGNOSIS — Z79899 Other long term (current) drug therapy: Secondary | ICD-10-CM | POA: Diagnosis not present

## 2021-07-27 DIAGNOSIS — I82432 Acute embolism and thrombosis of left popliteal vein: Secondary | ICD-10-CM | POA: Diagnosis not present

## 2021-07-27 DIAGNOSIS — Z7901 Long term (current) use of anticoagulants: Secondary | ICD-10-CM | POA: Diagnosis not present

## 2021-07-27 DIAGNOSIS — E039 Hypothyroidism, unspecified: Secondary | ICD-10-CM | POA: Insufficient documentation

## 2021-07-27 DIAGNOSIS — M79605 Pain in left leg: Secondary | ICD-10-CM | POA: Diagnosis present

## 2021-07-27 LAB — CBC WITH DIFFERENTIAL/PLATELET
Abs Immature Granulocytes: 0.05 10*3/uL (ref 0.00–0.07)
Basophils Absolute: 0.1 10*3/uL (ref 0.0–0.1)
Basophils Relative: 1 %
Eosinophils Absolute: 0 10*3/uL (ref 0.0–0.5)
Eosinophils Relative: 0 %
HCT: 41.3 % (ref 36.0–46.0)
Hemoglobin: 14.4 g/dL (ref 12.0–15.0)
Immature Granulocytes: 1 %
Lymphocytes Relative: 24 %
Lymphs Abs: 2.2 10*3/uL (ref 0.7–4.0)
MCH: 34.6 pg — ABNORMAL HIGH (ref 26.0–34.0)
MCHC: 34.9 g/dL (ref 30.0–36.0)
MCV: 99.3 fL (ref 80.0–100.0)
Monocytes Absolute: 0.5 10*3/uL (ref 0.1–1.0)
Monocytes Relative: 5 %
Neutro Abs: 6.7 10*3/uL (ref 1.7–7.7)
Neutrophils Relative %: 69 %
Platelets: 177 10*3/uL (ref 150–400)
RBC: 4.16 MIL/uL (ref 3.87–5.11)
RDW: 11.8 % (ref 11.5–15.5)
WBC: 9.5 10*3/uL (ref 4.0–10.5)
nRBC: 0 % (ref 0.0–0.2)

## 2021-07-27 LAB — BASIC METABOLIC PANEL
Anion gap: 12 (ref 5–15)
BUN: 10 mg/dL (ref 6–20)
CO2: 23 mmol/L (ref 22–32)
Calcium: 9.3 mg/dL (ref 8.9–10.3)
Chloride: 102 mmol/L (ref 98–111)
Creatinine, Ser: 0.71 mg/dL (ref 0.44–1.00)
GFR, Estimated: >60 mL/min (ref >60)
Glucose, Bld: 93 mg/dL (ref 70–99)
Potassium: 3.9 mmol/L (ref 3.5–5.1)
Sodium: 137 mmol/L (ref 135–145)

## 2021-07-27 LAB — PROTIME-INR
INR: 0.9 (ref 0.8–1.2)
Prothrombin Time: 12.2 seconds (ref 11.4–15.2)

## 2021-07-27 LAB — POC URINE PREG, ED: Preg Test, Ur: NEGATIVE

## 2021-07-27 MED ORDER — APIXABAN 5 MG PO TABS
10.0000 mg | ORAL_TABLET | Freq: Once | ORAL | Status: AC
  Administered 2021-07-27: 10 mg via ORAL
  Filled 2021-07-27: qty 2

## 2021-07-27 MED ORDER — APIXABAN 5 MG PO TABS: ORAL_TABLET | ORAL | 0 refills | Status: DC

## 2021-07-27 NOTE — ED Notes (Signed)
LLE propped up on pillow for patient comfort and elevation of swollen extremity.

## 2021-07-27 NOTE — Discharge Instructions (Addendum)
Discontinue your birth control pills.  Take the Eliquis as prescribed.  Follow-up with your primary care doctor.  Return to the ER for new or worsening swelling or pain, weakness or numbness in the leg, inability to put weight on it, shortness of breath, chest pain, palpitations, severe dizziness or lightheadedness, or any other new or worsening symptoms that concern you.

## 2021-07-27 NOTE — ED Provider Notes (Signed)
[facility] Emergency Department Provider Note ____________________________________________   Event Date/Time   First MD Initiated Contact with Patient 07/27/21 2155     (approximate)  I have reviewed the triage vital signs and the nursing notes.   HISTORY  Chief Complaint Leg Swelling    HPI Brittney Johnston is a 45 y.o. female with PMH as noted below currently on OCPs who presents with left leg pain over the last week, gradual onset, and acutely worsened and associated with swelling to the left leg in the last 2 days.  Patient reports remote history of a DVT more than 10 years ago when she was pregnant.  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  The patient denies any associated chest pain, difficulty breathing, weakness or lightheadedness.  Past Medical History:  Diagnosis Date   Abnormal findings on imaging of biliary tract    Calculus of common duct    Choledocholithiasis with acute cholecystitis with obstruction 02/07/2017   Elevated liver enzymes    Gallstones    Hypothyroid 09/04/2011   Obesity 09/04/2011   Thyroid disease    hypothyroidism    Patient Active Problem List   Diagnosis Date Noted   Choledocholithiasis with acute cholecystitis with obstruction 02/07/2017   Calculus of common duct    Elevated liver enzymes    Abnormal findings on imaging of biliary tract    Hypothyroid 09/04/2011   Obesity 09/04/2011    Past Surgical History:  Procedure Laterality Date   CHOLECYSTECTOMY N/A 02/08/2017   Procedure: LAPAROSCOPIC CHOLECYSTECTOMY WITH INTRAOPERATIVE CHOLANGIOGRAM;  Surgeon: Ricarda Frame, MD;  Location: [facility] ORS;  Service: General;  Laterality: N/A;   ERCP N/A 02/07/2017   Procedure: ENDOSCOPIC RETROGRADE CHOLANGIOPANCREATOGRAPHY (ERCP);  Surgeon: Midge Minium, MD;  Location: [facility] ENDOSCOPY;  Service: Endoscopy;  Laterality: N/A;   LAPAROSCOPIC CHOLECYSTECTOMY  02/08/2017     Prior to Admission medications   Medication Sig Start Date End Date Taking? Authorizing Provider  apixaban (ELIQUIS) 5 MG TABS tablet Take 2 tablets (10 mg total) by mouth 2 (two) times daily for 7 days, THEN 1 tablet (5 mg total) 2 (two) times daily for 21 days. 07/27/21 08/24/21 Yes Dionne Bucy, MD  amoxicillin-clavulanate (AUGMENTIN) 875-125 MG tablet Take 1 tablet by mouth every 12 (twelve) hours. 02/10/17   Ricarda Frame, MD  cholecalciferol (VITAMIN D) 1000 units tablet Take 1,000 Units by mouth daily.    [provider]  CRYSELLE-28 0.3-30 MG-MCG tablet Take 1 tablet by mouth daily.    [provider]  levothyroxine (SYNTHROID, LEVOTHROID) 112 MCG tablet Take 112 mcg by mouth daily.    [provider]  Multiple Vitamin (MULTIVITAMIN WITH MINERALS) TABS tablet Take 1 tablet by mouth daily.    [provider]    Allergies Patient has no known allergies.  Family History  Problem Relation Age of Onset   Hyperthyroidism Mother    Hyperthyroidism Father    Atrial fibrillation Father    Hodgkin's lymphoma Father        remission    Social History Social History   Tobacco Use   Smoking status: Never   Smokeless tobacco: Never  Substance Use Topics   Alcohol use: No   Drug use: No    Review of Systems  Constitutional: No fever. Eyes: No redness. ENT: No sore throat. Cardiovascular: Denies chest pain. Respiratory: Denies shortness of breath. Gastrointestinal: No vomiting or diarrhea.  Genitourinary: Negative for dysuria.  Musculoskeletal: Negative for back pain.  Positive for leg swelling. Skin: Negative for rash. Neurological: Negative for headaches, focal weakness or numbness.   ____________________________________________   PHYSICAL EXAM:  VITAL SIGNS: ED Triage Vitals  Enc Vitals Group     BP 07/27/21 1608 127/76     Pulse Rate 07/27/21 1608 67     Resp 07/27/21 1608 20     Temp 07/27/21 1608 98.9 F (37.2 C)      Temp Source 07/27/21 1608 Oral     SpO2 07/27/21 1608 97 %     Weight 07/27/21 1609 220 lb (99.8 kg)     Height 07/27/21 1609 5\' 5"  (1.651 m)     Head Circumference --      Peak Flow --      Pain Score 07/27/21 1609 5     Pain Loc --      Pain Edu? --      Excl. in GC? --     Constitutional: Alert and oriented. Well appearing and in no acute distress. Eyes: Conjunctivae are normal.  Head: Atraumatic. Nose: No congestion/rhinnorhea. Mouth/Throat: Mucous membranes are moist.   Neck: Normal range of motion.  Cardiovascular: Normal rate, regular rhythm.  Good peripheral circulation. Respiratory: Normal respiratory effort.  No retractions.  Gastrointestinal: No distention.  Musculoskeletal: Extremities warm and well perfused.  Left lower leg with moderate swelling compared to the right, nonpitting.  No erythema, induration, or abnormal warmth. Neurologic:  Normal speech and language. No gross focal neurologic deficits are appreciated.  Skin:  Skin is warm and dry. No rash noted. Psychiatric: Mood and affect are normal. Speech and behavior are normal.  ____________________________________________   LABS (all labs ordered are listed, but only abnormal results are displayed)  Labs Reviewed  CBC WITH DIFFERENTIAL/PLATELET - Abnormal; Notable for the following components:      Result Value   MCH 34.6 (*)    All other components within normal limits  BASIC METABOLIC PANEL  PROTIME-INR  POC URINE PREG, ED   ____________________________________________  EKG   ____________________________________________  RADIOLOGY  07/29/21 venous LLE: Popliteal and tibial vein DVT  ____________________________________________   PROCEDURES  Procedure(s) performed: No  Procedures  Critical Care performed: No ____________________________________________   INITIAL IMPRESSION / ASSESSMENT AND PLAN / ED COURSE  Pertinent labs & imaging results that were available during my care of the  patient were reviewed by me and considered in my medical decision making (see chart for details).   45 year old female with PMH as noted above presents with left lower leg pain and swelling over the last several days.  She has no chest pain or other PE symptoms.  She has a remote history of a DVT during pregnancy and at that time was diagnosed with "borderline" antiphospholipid syndrome but ultimately cleared by hematology to go on a low-dose of OCPs.  On exam the patient is well-appearing and her vital signs are normal she does have swelling to the left lower leg but no other significant exam findings.  Ultrasound obtained from triage confirms acute popliteal and tibial DVT.  I obtained basic labs and coags which are within normal limits.  I consulted Dr. 49 from vascular surgery who advises that there is no indication for thrombectomy given the distal location of the DVT, and agrees with treating the patient with Eliquis.  I counseled the patient extensively on the results of the work-up and plan of care.  I advised that she should discontinue the OCPs, start Eliquis, and  follow-up with her PMD.  The patient agrees with the plan.  Return precautions given, and she expresses understanding.   ____________________________________________   FINAL CLINICAL IMPRESSION(S) / ED DIAGNOSES  Final diagnoses:  Acute deep vein thrombosis (DVT) of popliteal vein of left lower extremity (HCC)      NEW MEDICATIONS STARTED DURING THIS VISIT:  New Prescriptions   APIXABAN (ELIQUIS) 5 MG TABS TABLET    Take 2 tablets (10 mg total) by mouth 2 (two) times daily for 7 days, THEN 1 tablet (5 mg total) 2 (two) times daily for 21 days.     Note:  This document was prepared using Dragon voice recognition software and may include unintentional dictation errors.    Dionne Bucy, MD 07/27/21 [phone]

## 2021-07-27 NOTE — ED Triage Notes (Signed)
Pt has pain in left lower leg for 1 week.  Pt has swelling to left foot and lower leg since yesterday.  Non smoker.  Pt on BCP's.  Pt alert  speech clear.

## 2021-07-27 NOTE — ED Notes (Signed)
Patient ambulatory to wheelchair for discharge. Patient taken to POV.

## 2021-08-03 ENCOUNTER — Inpatient Hospital Stay: Payer: 59 | Attending: Oncology | Admitting: Oncology

## 2021-08-03 ENCOUNTER — Other Ambulatory Visit: Payer: Self-pay

## 2021-08-03 ENCOUNTER — Inpatient Hospital Stay: Payer: 59

## 2021-08-03 ENCOUNTER — Encounter: Payer: Self-pay | Admitting: Oncology

## 2021-08-03 VITALS — BP 115/85 | HR 74 | Temp 98.4°F | Resp 16 | Wt 224.0 lb

## 2021-08-03 DIAGNOSIS — Z6837 Body mass index (BMI) 37.0-37.9, adult: Secondary | ICD-10-CM | POA: Insufficient documentation

## 2021-08-03 DIAGNOSIS — Z793 Long term (current) use of hormonal contraceptives: Secondary | ICD-10-CM | POA: Insufficient documentation

## 2021-08-03 DIAGNOSIS — E039 Hypothyroidism, unspecified: Secondary | ICD-10-CM | POA: Diagnosis not present

## 2021-08-03 DIAGNOSIS — Z7901 Long term (current) use of anticoagulants: Secondary | ICD-10-CM | POA: Insufficient documentation

## 2021-08-03 DIAGNOSIS — I82402 Acute embolism and thrombosis of unspecified deep veins of left lower extremity: Secondary | ICD-10-CM

## 2021-08-03 DIAGNOSIS — E669 Obesity, unspecified: Secondary | ICD-10-CM | POA: Diagnosis not present

## 2021-08-03 DIAGNOSIS — Z79899 Other long term (current) drug therapy: Secondary | ICD-10-CM | POA: Diagnosis not present

## 2021-08-03 DIAGNOSIS — I82432 Acute embolism and thrombosis of left popliteal vein: Secondary | ICD-10-CM

## 2021-08-03 LAB — ANTITHROMBIN III: AntiThromb III Func: 123 % — ABNORMAL HIGH (ref 75–120)

## 2021-08-03 MED ORDER — APIXABAN 5 MG PO TABS: 5.0000 mg | ORAL_TABLET | Freq: Two times a day (BID) | ORAL | 2 refills | Status: DC

## 2021-08-03 NOTE — Progress Notes (Signed)
[facility]  Telephone:([phone] Fax:([phone]  ID: Brittney Johnston OB: 1976-06-11  MR#: [id number]  GYK#:[id number]  Patient Care Team: Titus Mould, NP as PCP - General (Family Medicine)  CHIEF COMPLAINT: Unprovoked left leg DVT.  INTERVAL HISTORY: Patient is a 45 year old female who developed left leg pain and was subsequently found to have a popliteal DVT.  She is currently on birth control pills, but has no other transient risk factors.  She has no personal or family history of DVT.  She currently feels well and is asymptomatic.  She has no neurologic complaints.  She denies any recent fevers or illnesses.  She has a good appetite and denies weight loss.  She has no chest pain, shortness of breath, cough, or hemoptysis.  She denies any nausea, vomiting, constipation, diarrhea.  She has no urinary complaints.  Patient feels at her baseline and offers no specific complaints today.  REVIEW OF SYSTEMS:   Review of Systems  Constitutional: Negative.  Negative for fever, malaise/fatigue and weight loss.  Respiratory: Negative.  Negative for cough, hemoptysis and shortness of breath.   Cardiovascular: Negative.  Negative for chest pain and leg swelling.  Gastrointestinal: Negative.  Negative for abdominal pain.  Genitourinary: Negative.  Negative for dysuria.  Musculoskeletal: Negative.  Negative for back pain.  Skin: Negative.  Negative for rash.  Neurological: Negative.  Negative for dizziness, seizures, weakness and headaches.  Psychiatric/Behavioral: Negative.  The patient is not nervous/anxious.    As per HPI. Otherwise, a complete review of systems is negative.  PAST MEDICAL HISTORY: Past Medical History:  Diagnosis Date   Abnormal findings on imaging of biliary tract    Calculus of common duct    Choledocholithiasis with acute cholecystitis with obstruction 02/07/2017   Elevated liver enzymes    Gallstones    Hypothyroid 09/04/2011   Obesity  09/04/2011   Thyroid disease    hypothyroidism    PAST SURGICAL HISTORY: Past Surgical History:  Procedure Laterality Date   CHOLECYSTECTOMY N/A 02/08/2017   Procedure: LAPAROSCOPIC CHOLECYSTECTOMY WITH INTRAOPERATIVE CHOLANGIOGRAM;  Surgeon: Ricarda Frame, MD;  Location: [facility] ORS;  Service: General;  Laterality: N/A;   ERCP N/A 02/07/2017   Procedure: ENDOSCOPIC RETROGRADE CHOLANGIOPANCREATOGRAPHY (ERCP);  Surgeon: Midge Minium, MD;  Location: [facility] ENDOSCOPY;  Service: Endoscopy;  Laterality: N/A;   LAPAROSCOPIC CHOLECYSTECTOMY  02/08/2017    FAMILY HISTORY: Family History  Problem Relation Age of Onset   Hyperthyroidism Mother    Hyperthyroidism Father    Atrial fibrillation Father    Hodgkin's lymphoma Father        remission    ADVANCED DIRECTIVES (Y/N):  N  HEALTH MAINTENANCE: Social History   Tobacco Use   Smoking status: Never   Smokeless tobacco: Never  Vaping Use   Vaping Use: Never used  Substance Use Topics   Alcohol use: No   Drug use: No     Colonoscopy:  PAP:  Bone density:  Lipid panel:  No Known Allergies  Current Outpatient Medications  Medication Sig Dispense Refill   apixaban (ELIQUIS) 5 MG TABS tablet Take 2 tablets (10 mg total) by mouth 2 (two) times daily for 7 days, THEN 1 tablet (5 mg total) 2 (two) times daily for 21 days. 60 tablet 0   cholecalciferol (VITAMIN D) 1000 units tablet Take 1,000 Units by mouth daily.     levothyroxine (SYNTHROID, LEVOTHROID) 112 MCG tablet Take 112 mcg by mouth daily.     Multiple Vitamin (MULTIVITAMIN WITH MINERALS) TABS  tablet Take 1 tablet by mouth daily.     No current facility-administered medications for this visit.    OBJECTIVE: Vitals:   08/03/21 1126  BP: 115/85  Pulse: 74  Resp: 16  Temp: 98.4 F (36.9 C)  SpO2: 97%     Body mass index is 37.28 kg/m.    ECOG FS:0 - Asymptomatic  General: Well-developed, well-nourished, no acute distress. Eyes: Pink conjunctiva, anicteric  sclera. HEENT: Normocephalic, moist mucous membranes. Lungs: No audible wheezing or coughing. Heart: Regular rate and rhythm. Abdomen: Soft, nontender, no obvious distention. Musculoskeletal: No edema, cyanosis, or clubbing. Neuro: Alert, answering all questions appropriately. Cranial nerves grossly intact. Skin: No rashes or petechiae noted. Psych: Normal affect. Lymphatics: No cervical, calvicular, axillary or inguinal LAD.   LAB RESULTS:  Lab Results  Component Value Date   NA 137 07/27/2021   K 3.9 07/27/2021   CL 102 07/27/2021   CO2 23 07/27/2021   GLUCOSE 93 07/27/2021   BUN 10 07/27/2021   CREATININE 0.71 07/27/2021   CALCIUM 9.3 07/27/2021   PROT 7.0 02/12/2017   ALBUMIN 3.6 02/12/2017   AST 74 (H) 02/12/2017   ALT 240 (H) 02/12/2017   ALKPHOS 187 (H) 02/12/2017   BILITOT 1.4 (H) 02/12/2017   GFRNONAA >60 07/27/2021   GFRAA >60 02/12/2017    Lab Results  Component Value Date   WBC 9.5 07/27/2021   NEUTROABS 6.7 07/27/2021   HGB 14.4 07/27/2021   HCT 41.3 07/27/2021   MCV 99.3 07/27/2021   PLT 177 07/27/2021     STUDIES: US Venous Img Lower Unilateral Left  Result Date: 07/27/2021 CLINICAL DATA:  46 year old female with leg swelling EXAM: LEFT LOWER EXTREMITY VENOUS DOPPLER ULTRASOUND TECHNIQUE: Gray-scale sonography with graded compression, as well as color Doppler and duplex ultrasound were performed to evaluate the lower extremity deep venous systems from the level of the common femoral vein and including the common femoral, femoral, profunda femoral, popliteal and calf veins including the posterior tibial, peroneal and gastrocnemius veins when visible. The superficial great saphenous vein was also interrogated. Spectral Doppler was utilized to evaluate flow at rest and with distal augmentation maneuvers in the common femoral, femoral and popliteal veins. COMPARISON:  None. FINDINGS: Contralateral Common Femoral Vein: Respiratory phasicity is normal and  symmetric with the symptomatic side. No evidence of thrombus. Normal compressibility. Common Femoral Vein: No evidence of thrombus. Normal compressibility, respiratory phasicity and response to augmentation. Saphenofemoral Junction: No evidence of thrombus. Normal compressibility and flow on color Doppler imaging. Profunda Femoral Vein: No evidence of thrombus. Normal compressibility and flow on color Doppler imaging. Femoral Vein: No evidence of thrombus. Normal compressibility, respiratory phasicity and response to augmentation. Popliteal Vein: Occlusive thrombus of the popliteal vein. Calf Veins: Occlusive thrombus of the posterior tibial vein and peroneal vein Superficial Great Saphenous Vein: No evidence of thrombus. Normal compressibility and flow on color Doppler imaging. Venous Reflux:  None. Other Findings:  None. IMPRESSION: Sonographic survey of the left lower extremity positive for DVT of the left popliteal vein extending distally to involve the tibial veins Results called to emergency team by the tech performing the study. Electronically Signed   By: Gilmer Mor D.O.   On: 07/27/2021 17:08    ASSESSMENT: Unprovoked left leg DVT.  PLAN:    Unprovoked left leg DVT: Patient on birth control pills, but has no other transient risk factors.  She has no personal or family history of DVT.  She is currently on Eliquis which she  will require to take for a minimum of 3 months.  Patient was given a refill today.  Will do full hypercoagulable work-up for completeness.  Patient will have video assisted telemedicine visit in 1 month to discuss her laboratory results and determine the length of anticoagulation needed.  I spent a total of 45 minutes reviewing chart data, face-to-face evaluation with the patient, counseling and coordination of care as detailed above.   Patient expressed understanding and was in agreement with this plan. She also understands that She can call clinic at any time with any  questions, concerns, or complaints.    Jeralyn Ruths, MD   08/03/2021 1:51 PM

## 2021-08-03 NOTE — Progress Notes (Signed)
New pt referred by Dr Susann Givens for acute embolism and thrombosis of left popliteal

## 2021-08-05 LAB — PROTEIN C, TOTAL: Protein C, Total: 138 % (ref 60–150)

## 2021-08-06 LAB — PROTEIN S ACTIVITY: Protein S Activity: 93 % (ref 63–140)

## 2021-08-06 LAB — PROTEIN C ACTIVITY: Protein C Activity: 163 % (ref 73–180)

## 2021-08-06 LAB — PROTEIN S, TOTAL: Protein S Ag, Total: 96 % (ref 60–150)

## 2021-08-07 LAB — LUPUS ANTICOAGULANT PANEL
DRVVT: 133.3 s — ABNORMAL HIGH (ref 0.0–47.0)
PTT Lupus Anticoagulant: 50.6 s (ref 0.0–51.9)

## 2021-08-07 LAB — DRVVT MIX: dRVVT Mix: 84.1 s — ABNORMAL HIGH (ref 0.0–40.4)

## 2021-08-07 LAB — DRVVT CONFIRM: dRVVT Confirm: 1.9 ratio — ABNORMAL HIGH (ref 0.8–1.2)

## 2021-08-09 LAB — PROTHROMBIN GENE MUTATION

## 2021-08-09 LAB — FACTOR 5 LEIDEN

## 2021-08-10 LAB — CARDIOLIPIN ANTIBODIES, IGG, IGM, IGA
Anticardiolipin IgA: <9 U/mL (ref 0–11)
Anticardiolipin IgG: 40 GPL U/mL — ABNORMAL HIGH (ref 0–14)
Anticardiolipin IgM: 19 [MPL'U]/mL — ABNORMAL HIGH (ref 0–12)

## 2021-09-02 NOTE — Progress Notes (Signed)
[facility]  [phone] [phone]) [phone]  ID: Brittney Johnston OB: Sep 20, 1976  MR#: [id number]  HKV#:[id number]  Patient Care Team: Titus Mould, NP as PCP - General (Family Medicine)  I connected with Brittney Johnston on 09/05/21 at  3:30 PM EDT by video enabled telemedicine visit and verified that I am speaking with the correct person using two identifiers.   I discussed the limitations, risks, security and privacy concerns of performing an evaluation and management service by telemedicine and the availability of in-person appointments. I also discussed with the patient that there may be a patient responsible charge related to this service. The patient expressed understanding and agreed to proceed.   Other persons participating in the visit and their role in the encounter: Patient, MD.  Patient's location: Home. Provider's location: Clinic.  CHIEF COMPLAINT: Unprovoked left leg DVT.  INTERVAL HISTORY: Patient agreed to video assisted telemedicine visit for further evaluation and discussion of her laboratory results.  She continues to have intermittent leg pain and swelling, but otherwise feels well.  She is tolerating Eliquis without significant side effects.  She has no neurologic complaints.  She denies any recent fevers or illnesses.  She has a good appetite and denies weight loss.  She has no chest pain, shortness of breath, cough, or hemoptysis.  She denies any nausea, vomiting, constipation, diarrhea.  She has no urinary complaints.  Patient offers no further specific complaints today.  REVIEW OF SYSTEMS:   Review of Systems  Constitutional: Negative.  Negative for fever, malaise/fatigue and weight loss.  Respiratory: Negative.  Negative for cough, hemoptysis and shortness of breath.   Cardiovascular: Negative.  Negative for chest pain and leg swelling.  Gastrointestinal: Negative.  Negative for abdominal pain.  Genitourinary: Negative.   Negative for dysuria.  Musculoskeletal: Negative.  Negative for back pain.  Skin: Negative.  Negative for rash.  Neurological: Negative.  Negative for dizziness, seizures, weakness and headaches.  Psychiatric/Behavioral: Negative.  The patient is not nervous/anxious.    As per HPI. Otherwise, a complete review of systems is negative.  PAST MEDICAL HISTORY: Past Medical History:  Diagnosis Date   Abnormal findings on imaging of biliary tract    Calculus of common duct    Choledocholithiasis with acute cholecystitis with obstruction 02/07/2017   Elevated liver enzymes    Gallstones    Hypothyroid 09/04/2011   Obesity 09/04/2011   Thyroid disease    hypothyroidism    PAST SURGICAL HISTORY: Past Surgical History:  Procedure Laterality Date   CHOLECYSTECTOMY N/A 02/08/2017   Procedure: LAPAROSCOPIC CHOLECYSTECTOMY WITH INTRAOPERATIVE CHOLANGIOGRAM;  Surgeon: Ricarda Frame, MD;  Location: [facility] ORS;  Service: General;  Laterality: N/A;   ERCP N/A 02/07/2017   Procedure: ENDOSCOPIC RETROGRADE CHOLANGIOPANCREATOGRAPHY (ERCP);  Surgeon: Midge Minium, MD;  Location: [facility] ENDOSCOPY;  Service: Endoscopy;  Laterality: N/A;   LAPAROSCOPIC CHOLECYSTECTOMY  02/08/2017    FAMILY HISTORY: Family History  Problem Relation Age of Onset   Hyperthyroidism Mother    Hyperthyroidism Father    Atrial fibrillation Father    Hodgkin's lymphoma Father        remission    ADVANCED DIRECTIVES (Y/N):  N  HEALTH MAINTENANCE: Social History   Tobacco Use   Smoking status: Never   Smokeless tobacco: Never  Vaping Use   Vaping Use: Never used  Substance Use Topics   Alcohol use: No   Drug use: No     Colonoscopy:  PAP:  Bone density:  Lipid panel:  No Known  Allergies  Current Outpatient Medications  Medication Sig Dispense Refill   apixaban (ELIQUIS) 5 MG TABS tablet Take 2 tablets (10 mg total) by mouth 2 (two) times daily for 7 days, THEN 1 tablet (5 mg total) 2 (two) times daily for 21  days. 60 tablet 0   apixaban (ELIQUIS) 5 MG TABS tablet Take 1 tablet (5 mg total) by mouth 2 (two) times daily. 60 tablet 2   cholecalciferol (VITAMIN D) 1000 units tablet Take 1,000 Units by mouth daily.     levothyroxine (SYNTHROID, LEVOTHROID) 112 MCG tablet Take 112 mcg by mouth daily.     Multiple Vitamin (MULTIVITAMIN WITH MINERALS) TABS tablet Take 1 tablet by mouth daily.     No current facility-administered medications for this visit.    OBJECTIVE: There were no vitals filed for this visit.    There is no height or weight on file to calculate BMI.    ECOG FS:0 - Asymptomatic  General: Well-developed, well-nourished, no acute distress. HEENT: Normocephalic. Neuro: Alert, answering all questions appropriately. Cranial nerves grossly intact. Psych: Normal affect.   LAB RESULTS:  Lab Results  Component Value Date   NA 137 07/27/2021   K 3.9 07/27/2021   CL 102 07/27/2021   CO2 23 07/27/2021   GLUCOSE 93 07/27/2021   BUN 10 07/27/2021   CREATININE 0.71 07/27/2021   CALCIUM 9.3 07/27/2021   PROT 7.0 02/12/2017   ALBUMIN 3.6 02/12/2017   AST 74 (H) 02/12/2017   ALT 240 (H) 02/12/2017   ALKPHOS 187 (H) 02/12/2017   BILITOT 1.4 (H) 02/12/2017   GFRNONAA >60 07/27/2021   GFRAA >60 02/12/2017    Lab Results  Component Value Date   WBC 9.5 07/27/2021   NEUTROABS 6.7 07/27/2021   HGB 14.4 07/27/2021   HCT 41.3 07/27/2021   MCV 99.3 07/27/2021   PLT 177 07/27/2021     STUDIES: No results found.  ASSESSMENT: Unprovoked left leg DVT.  PLAN:    Unprovoked left leg DVT: Patient on birth control pills, but has no other transient risk factors.  She has no personal or family history of DVT.  She was noted to have a low positive anticardiolipin antibody as well as a mildly positive lupus anticoagulant. These both can be falsely positive secondary to Eliquis and also can be transient in nature.  The remainder of her hypercoagulable work-up was either negative or within  normal limits.  Patient will complete 3 months of Eliquis treatment at the end of November 2022 and has been instructed to discontinue treatment at that time.  Will repeat laboratory work at the beginning of March 2023 off of Eliquis.  She will then have video assisted telemedicine visit 2 weeks later to discuss the results.    I provided 20 minutes of face-to-face video visit time during this encounter which included chart review, counseling, and coordination of care as documented above.   Patient expressed understanding and was in agreement with this plan. She also understands that She can call clinic at any time with any questions, concerns, or complaints.    Jeralyn Ruths, MD   09/05/2021 3:37 PM

## 2021-09-05 ENCOUNTER — Inpatient Hospital Stay: Payer: 59 | Attending: Oncology | Admitting: Oncology

## 2021-09-05 DIAGNOSIS — I82402 Acute embolism and thrombosis of unspecified deep veins of left lower extremity: Secondary | ICD-10-CM | POA: Diagnosis not present

## 2021-09-18 NOTE — Progress Notes (Signed)
PA for Eliquis 5mg  submitted via Cover My Meds (Key: [id number]) has been approved 08/19/2021 thru10/09/2022

## 2021-10-10 ENCOUNTER — Encounter: Payer: Self-pay | Admitting: Oncology

## 2021-11-10 ENCOUNTER — Other Ambulatory Visit: Payer: Self-pay | Admitting: Oncology

## 2022-02-20 ENCOUNTER — Inpatient Hospital Stay: Payer: 59 | Attending: Oncology

## 2022-02-20 ENCOUNTER — Other Ambulatory Visit: Payer: Self-pay

## 2022-02-20 DIAGNOSIS — Z7901 Long term (current) use of anticoagulants: Secondary | ICD-10-CM | POA: Diagnosis not present

## 2022-02-20 DIAGNOSIS — I82402 Acute embolism and thrombosis of unspecified deep veins of left lower extremity: Secondary | ICD-10-CM | POA: Insufficient documentation

## 2022-02-21 LAB — LUPUS ANTICOAGULANT PANEL
DRVVT: 40.9 s (ref 0.0–47.0)
PTT Lupus Anticoagulant: 42.4 s (ref 0.0–43.5)

## 2022-02-22 LAB — CARDIOLIPIN ANTIBODIES, IGG, IGM, IGA
Anticardiolipin IgA: <9 APL U/mL (ref 0–11)
Anticardiolipin IgG: 35 GPL U/mL — ABNORMAL HIGH (ref 0–14)
Anticardiolipin IgM: 10 MPL U/mL (ref 0–12)

## 2022-02-25 NOTE — Progress Notes (Signed)
?[facility]  ?Telephone:(336) [phone] [phone] ? ?ID: Brittney Johnston OB: Nov 26, 1976  MR#: [id number]  IRJ#:[id number] ? ?Patient Care Team: ?Titus Mould, NP as PCP - General (Family Medicine) ? ?I connected with Brittney Johnston on 03/01/22 at  2:30 PM EDT by video enabled telemedicine visit and verified that I am speaking with the correct person using two identifiers.  ? ?I discussed the limitations, risks, security and privacy concerns of performing an evaluation and management service by telemedicine and the availability of in-person appointments. I also discussed with the patient that there may be a patient responsible charge related to this service. The patient expressed understanding and agreed to proceed.  ? ?Other persons participating in the visit and their role in the encounter: Patient, MD. ? ?Patient?s location: Home. ?Provider?s location: Clinic. ? ?CHIEF COMPLAINT: Unprovoked left leg DVT. ? ?INTERVAL HISTORY: Patient agreed to video assisted telemedicine visit for further evaluation and discussion of her laboratory results.  She currently feels well and is asymptomatic.  She has occasional, transient left leg pain which resolves without intervention.  She discontinued Eliquis in approximately November 2022.  She has no neurologic complaints.  She denies any recent fevers or illnesses.  She has a good appetite and denies weight loss.  She has no chest pain, shortness of breath, cough, or hemoptysis.  She denies any nausea, vomiting, constipation, diarrhea.  She has no urinary complaints.  Patient offers no further specific complaints today. ? ?REVIEW OF SYSTEMS:   ?Review of Systems  ?Constitutional: Negative.  Negative for fever, malaise/fatigue and weight loss.  ?Respiratory: Negative.  Negative for cough, hemoptysis and shortness of breath.   ?Cardiovascular: Negative.  Negative for chest pain and leg swelling.  ?Gastrointestinal: Negative.  Negative for abdominal  pain.  ?Genitourinary: Negative.  Negative for dysuria.  ?Musculoskeletal: Negative.  Negative for back pain.  ?Skin: Negative.  Negative for rash.  ?Neurological: Negative.  Negative for dizziness, seizures, weakness and headaches.  ?Psychiatric/Behavioral: Negative.  The patient is not nervous/anxious.   ? ?As per HPI. Otherwise, a complete review of systems is negative. ? ?PAST MEDICAL HISTORY: ?Past Medical History:  ?Diagnosis Date  ? Abnormal findings on imaging of biliary tract   ? Calculus of common duct   ? Choledocholithiasis with acute cholecystitis with obstruction 02/07/2017  ? Elevated liver enzymes   ? Gallstones   ? Hypothyroid 09/04/2011  ? Obesity 09/04/2011  ? Thyroid disease   ? hypothyroidism  ? ? ?PAST SURGICAL HISTORY: ?Past Surgical History:  ?Procedure Laterality Date  ? CHOLECYSTECTOMY N/A 02/08/2017  ? Procedure: LAPAROSCOPIC CHOLECYSTECTOMY WITH INTRAOPERATIVE CHOLANGIOGRAM;  Surgeon: Ricarda Frame, MD;  Location: [facility] ORS;  Service: General;  Laterality: N/A;  ? ERCP N/A 02/07/2017  ? Procedure: ENDOSCOPIC RETROGRADE CHOLANGIOPANCREATOGRAPHY (ERCP);  Surgeon: Midge Minium, MD;  Location: [facility] ENDOSCOPY;  Service: Endoscopy;  Laterality: N/A;  ? LAPAROSCOPIC CHOLECYSTECTOMY  02/08/2017  ? ? ?FAMILY HISTORY: ?Family History  ?Problem Relation Age of Onset  ? Hyperthyroidism Mother   ? Hyperthyroidism Father   ? Atrial fibrillation Father   ? Hodgkin's lymphoma Father   ?     remission  ? ? ?ADVANCED DIRECTIVES (Y/N):  N ? ?HEALTH MAINTENANCE: ?Social History  ? ?Tobacco Use  ? Smoking status: Never  ? Smokeless tobacco: Never  ?Vaping Use  ? Vaping Use: Never used  ?Substance Use Topics  ? Alcohol use: No  ? Drug use: No  ? ? ? Colonoscopy: ? PAP: ? Bone density: ?  Lipid panel: ? ?No Known Allergies ? ?Current Outpatient Medications  ?Medication Sig Dispense Refill  ? apixaban (ELIQUIS) 5 MG TABS tablet Take 2 tablets (10 mg total) by mouth 2 (two) times daily for 7 days, THEN 1 tablet (5 mg  total) 2 (two) times daily for 21 days. 60 tablet 0  ? cholecalciferol (VITAMIN D) 1000 units tablet Take 1,000 Units by mouth daily.    ? ELIQUIS 5 MG TABS tablet TAKE 1 TABLET BY MOUTH TWICE A DAY 60 tablet 2  ? levothyroxine (SYNTHROID, LEVOTHROID) 112 MCG tablet Take 112 mcg by mouth daily.    ? Multiple Vitamin (MULTIVITAMIN WITH MINERALS) TABS tablet Take 1 tablet by mouth daily.    ? ?No current facility-administered medications for this visit.  ? ? ?OBJECTIVE: ?There were no vitals filed for this visit. ?   There is no height or weight on file to calculate BMI.    ECOG FS:0 - Asymptomatic ? ?General: Well-developed, well-nourished, no acute distress. ?HEENT: Normocephalic. ?Neuro: Alert, answering all questions appropriately. Cranial nerves grossly intact. ?Psych: Normal affect. ? ? ?LAB RESULTS: ? ?Lab Results  ?Component Value Date  ? NA 137 07/27/2021  ? K 3.9 07/27/2021  ? CL 102 07/27/2021  ? CO2 23 07/27/2021  ? GLUCOSE 93 07/27/2021  ? BUN 10 07/27/2021  ? CREATININE 0.71 07/27/2021  ? CALCIUM 9.3 07/27/2021  ? PROT 7.0 02/12/2017  ? ALBUMIN 3.6 02/12/2017  ? AST 74 (H) 02/12/2017  ? ALT 240 (H) 02/12/2017  ? ALKPHOS 187 (H) 02/12/2017  ? BILITOT 1.4 (H) 02/12/2017  ? GFRNONAA >60 07/27/2021  ? GFRAA >60 02/12/2017  ? ? ?Lab Results  ?Component Value Date  ? WBC 9.5 07/27/2021  ? NEUTROABS 6.7 07/27/2021  ? HGB 14.4 07/27/2021  ? HCT 41.3 07/27/2021  ? MCV 99.3 07/27/2021  ? PLT 177 07/27/2021  ? ? ? ?STUDIES: ?No results found. ? ?ASSESSMENT: Unprovoked left leg DVT. ? ?PLAN:   ? ?Unprovoked left leg DVT: Possibly related to birth control pills which have been subsequently discontinued.  Patient had no other transient risk factors.  She has no previous personal or family history of DVT.  Full hypercoagulable work-up was essentially negative.  Positive lupus anticoagulant has now resolved.  Her low positive anticardiolipin antibody has decreased.  This is likely clinically insignificant.  Patient  discontinued Eliquis after 3 months of treatment in November 2022.  No further intervention is needed at this time.  Patient expressed understanding that she had another blood clot for any reason, she might require lifelong anticoagulation.  No further follow-up has been necessary.  Please refer patient back if there are any questions or concerns.   ? ?I provided 20 minutes of face-to-face video visit time during this encounter which included chart review, counseling, and coordination of care as documented above. ? ? ?Patient expressed understanding and was in agreement with this plan. She also understands that She can call clinic at any time with any questions, concerns, or complaints.  ? ? ?Jeralyn Ruths, MD   03/01/2022 6:35 AM ? ? ? ? ?

## 2022-02-27 ENCOUNTER — Inpatient Hospital Stay (HOSPITAL_BASED_OUTPATIENT_CLINIC_OR_DEPARTMENT_OTHER): Payer: 59 | Admitting: Oncology

## 2022-02-27 DIAGNOSIS — Z7901 Long term (current) use of anticoagulants: Secondary | ICD-10-CM | POA: Diagnosis not present

## 2022-02-27 DIAGNOSIS — I82402 Acute embolism and thrombosis of unspecified deep veins of left lower extremity: Secondary | ICD-10-CM | POA: Diagnosis not present

## 2022-03-04 ENCOUNTER — Other Ambulatory Visit: Payer: Self-pay | Admitting: Oncology

## 2022-03-05 ENCOUNTER — Encounter: Payer: Self-pay | Admitting: Oncology

## 2022-03-05 ENCOUNTER — Other Ambulatory Visit: Payer: Self-pay | Admitting: Emergency Medicine

## 2022-03-05 DIAGNOSIS — I82402 Acute embolism and thrombosis of unspecified deep veins of left lower extremity: Secondary | ICD-10-CM

## 2022-03-06 ENCOUNTER — Ambulatory Visit
Admission: RE | Admit: 2022-03-06 | Discharge: 2022-03-06 | Disposition: A | Discharge status: Home / Self Care | Payer: 59 | Source: Ambulatory Visit | Attending: Oncology | Admitting: Oncology

## 2022-03-06 ENCOUNTER — Other Ambulatory Visit: Payer: Self-pay

## 2022-03-06 DIAGNOSIS — I82402 Acute embolism and thrombosis of unspecified deep veins of left lower extremity: Secondary | ICD-10-CM | POA: Insufficient documentation

## 2023-06-07 IMAGING — US US EXTREM LOW VENOUS*L*
1 series · 13 of 24 positions shown · non-contrast
Comparison: None.

CLINICAL DATA: Left leg pain



[Series 1: us venous img lower uni left (dvt) · portal-venous · 13 of 32 slices shown]
[im 1/32]
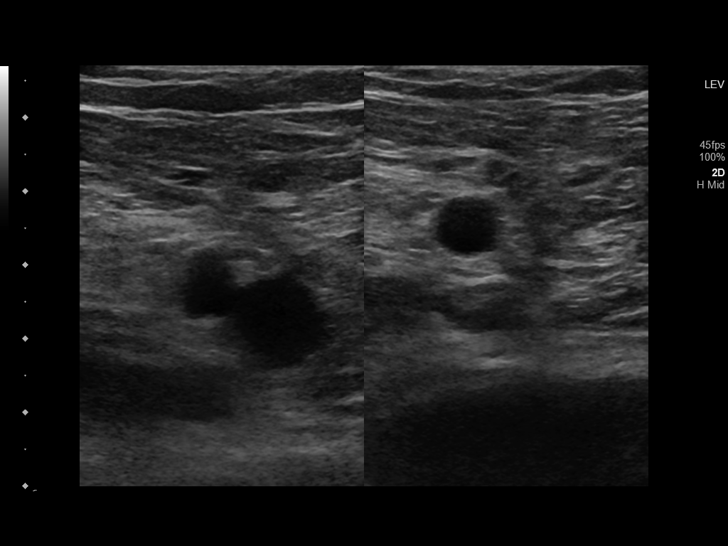
[im 3/32]
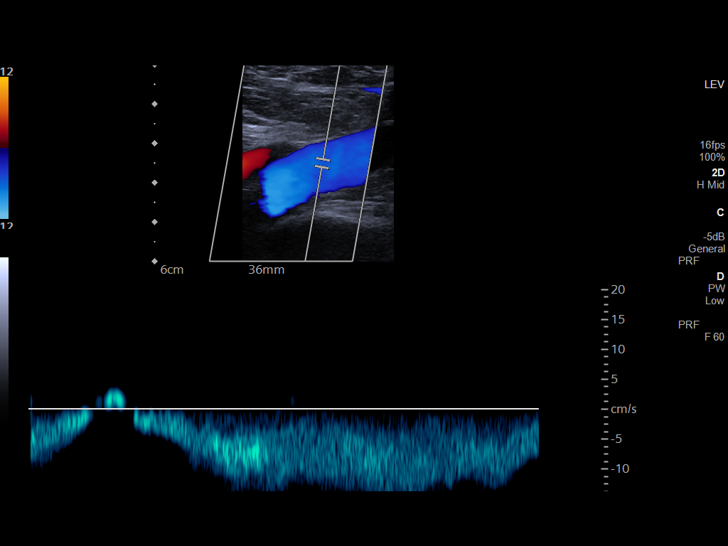
[im 6/32]
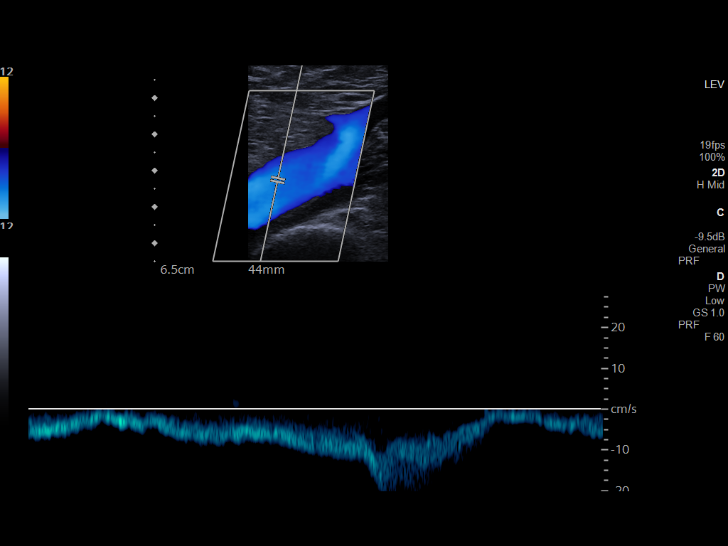
[im 9/32]
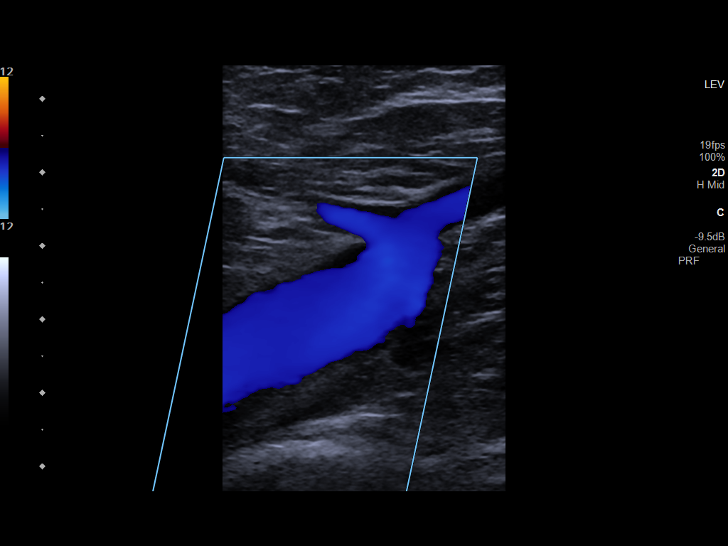
[im 11/32]
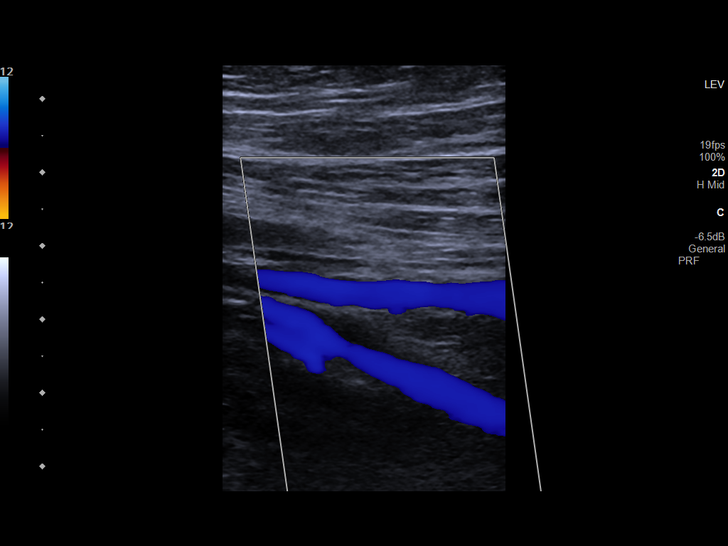
[im 14/32]
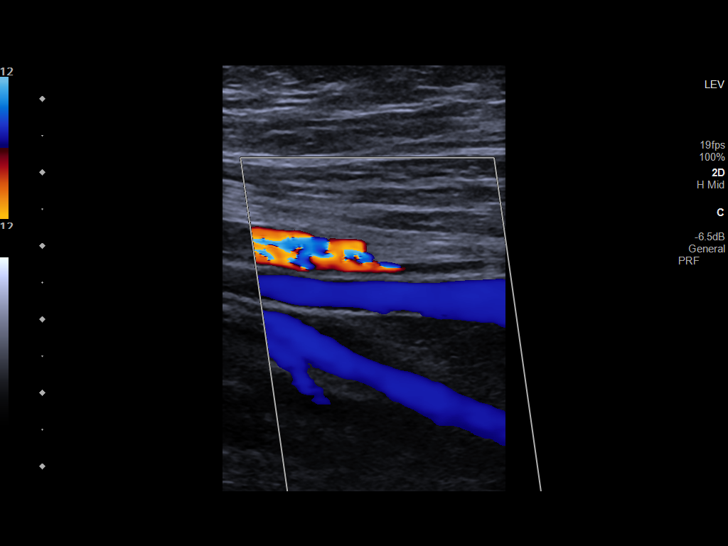
[im 17/32]
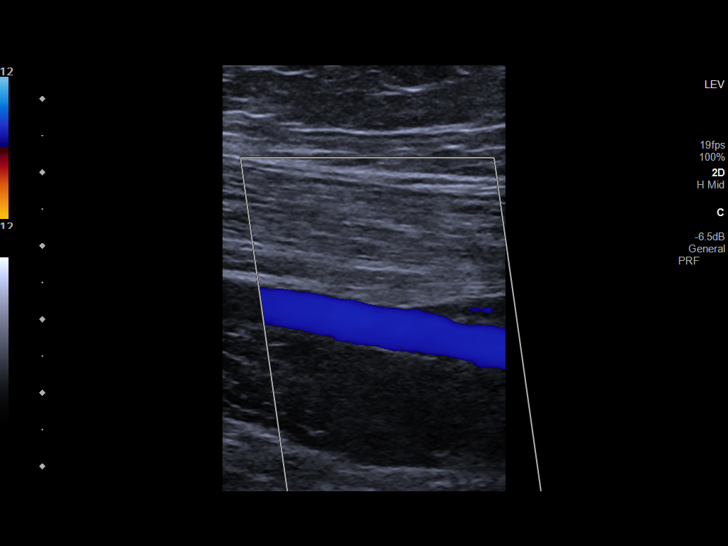
[im 18/32]
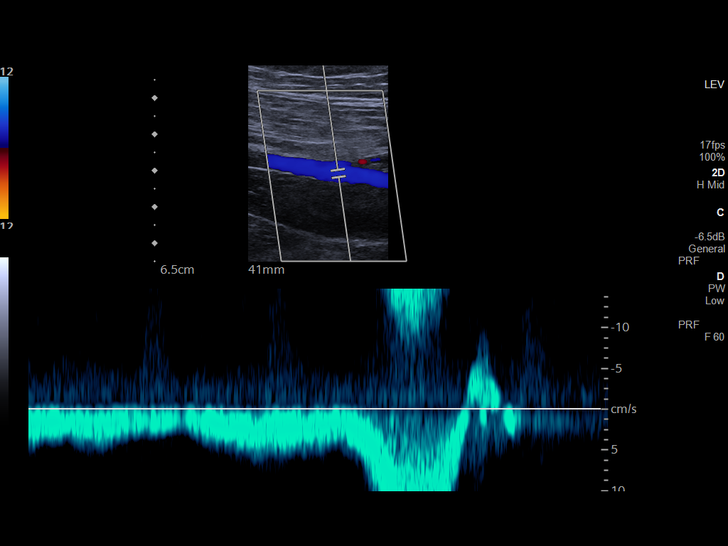
[im 21/32]
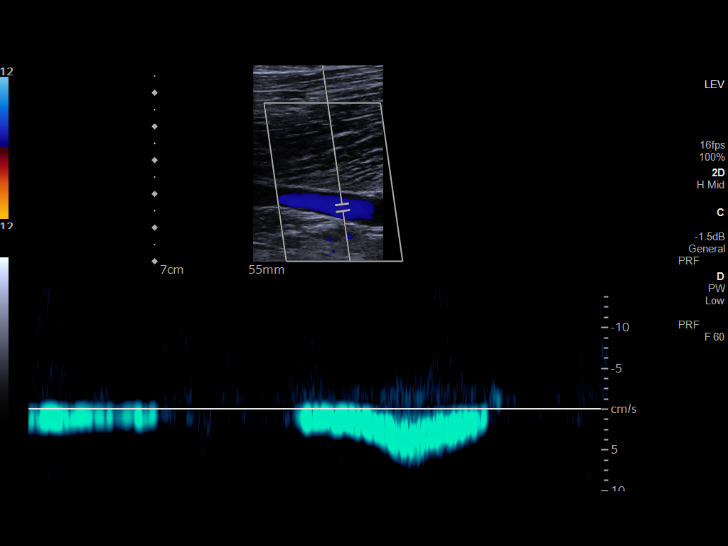
[im 23/32]
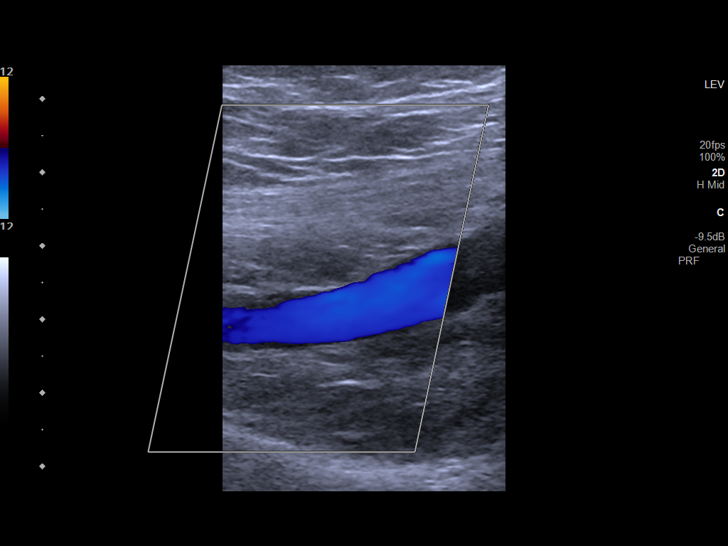
[im 26/32]
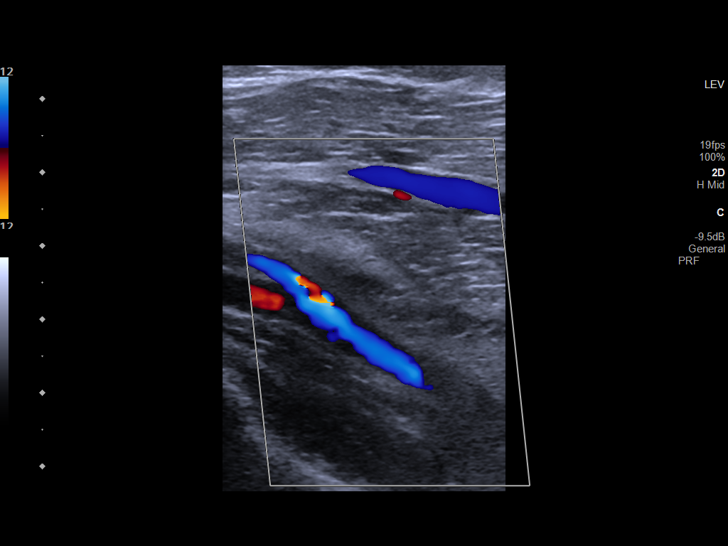
[im 29/32]
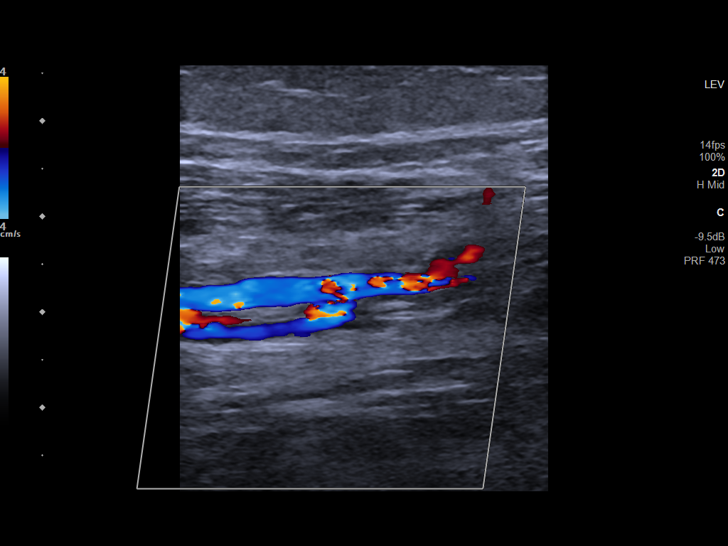
[im 32/32]
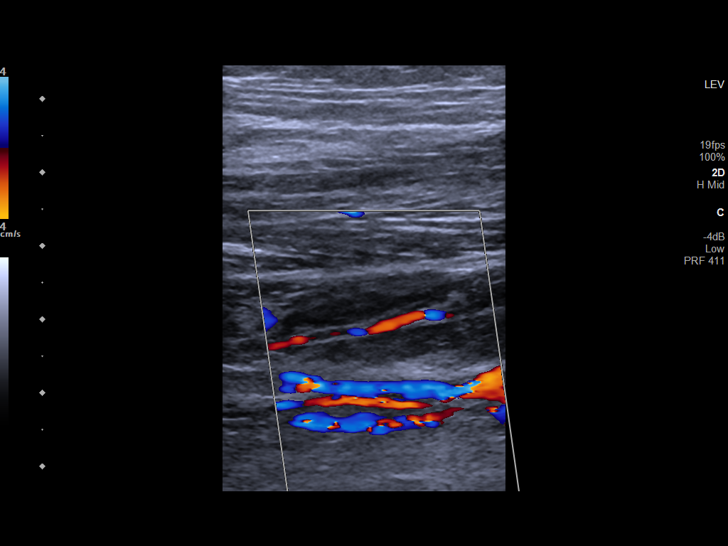

[13 of 24 positions shown; findings below may reference images not displayed]

FINDINGS: Contralateral Common Femoral Vein: Respiratory phasicity is normal
and symmetric with the symptomatic side. No evidence of thrombus.
Normal compressibility.

Common Femoral Vein: No evidence of thrombus. Normal
compressibility, respiratory phasicity and response to augmentation.

Saphenofemoral Junction: No evidence of thrombus. Normal
compressibility and flow on color Doppler imaging.

Profunda Femoral Vein: No evidence of thrombus. Normal
compressibility and flow on color Doppler imaging.

Femoral Vein: No evidence of thrombus. Normal compressibility,
respiratory phasicity and response to augmentation.

Popliteal Vein: No evidence of thrombus. Normal compressibility,
respiratory phasicity and response to augmentation.

Calf Veins: No evidence of thrombus. Normal compressibility and flow
on color Doppler imaging.
IMPRESSION: No evidence of deep venous thrombosis.
